# Patient Record
Sex: Female | Born: 1935 | Race: White | Hispanic: No | Marital: Single | Smoking: Former smoker
Health system: Southern US, Community
[De-identification: ages and names within clinical notes are randomized; demographics above are authoritative.]

## PROBLEM LIST (undated history)

## (undated) DIAGNOSIS — M1990 Unspecified osteoarthritis, unspecified site: Secondary | ICD-10-CM

## (undated) DIAGNOSIS — I872 Venous insufficiency (chronic) (peripheral): Secondary | ICD-10-CM

## (undated) DIAGNOSIS — R413 Other amnesia: Secondary | ICD-10-CM

## (undated) DIAGNOSIS — I480 Paroxysmal atrial fibrillation: Secondary | ICD-10-CM

## (undated) DIAGNOSIS — T462X1A Poisoning by other antidysrhythmic drugs, accidental (unintentional), initial encounter: Secondary | ICD-10-CM

## (undated) DIAGNOSIS — S82409A Unspecified fracture of shaft of unspecified fibula, initial encounter for closed fracture: Secondary | ICD-10-CM

## (undated) DIAGNOSIS — E039 Hypothyroidism, unspecified: Secondary | ICD-10-CM

## (undated) DIAGNOSIS — E88819 Insulin resistance, unspecified: Secondary | ICD-10-CM

## (undated) DIAGNOSIS — Z87440 Personal history of urinary (tract) infections: Secondary | ICD-10-CM

## (undated) DIAGNOSIS — D649 Anemia, unspecified: Secondary | ICD-10-CM

## (undated) DIAGNOSIS — R2681 Unsteadiness on feet: Secondary | ICD-10-CM

## (undated) DIAGNOSIS — E8881 Metabolic syndrome: Secondary | ICD-10-CM

## (undated) DIAGNOSIS — I10 Essential (primary) hypertension: Secondary | ICD-10-CM

## (undated) DIAGNOSIS — J449 Chronic obstructive pulmonary disease, unspecified: Secondary | ICD-10-CM

## (undated) HISTORY — DX: Essential (primary) hypertension: I10

## (undated) HISTORY — DX: Paroxysmal atrial fibrillation: I48.0

## (undated) HISTORY — DX: Unsteadiness on feet: R26.81

## (undated) HISTORY — DX: Hypothyroidism, unspecified: E03.9

## (undated) HISTORY — DX: Anemia, unspecified: D64.9

## (undated) HISTORY — PX: TOTAL HIP ARTHROPLASTY: SHX124

## (undated) HISTORY — DX: Personal history of urinary (tract) infections: Z87.440

## (undated) HISTORY — PX: CHOLECYSTECTOMY: SHX55

## (undated) HISTORY — PX: TONSILLECTOMY: SUR1361

## (undated) HISTORY — DX: Insulin resistance, unspecified: E88.819

## (undated) HISTORY — PX: APPENDECTOMY: SHX54

## (undated) HISTORY — DX: Poisoning by other antidysrhythmic drugs, accidental (unintentional), initial encounter: T46.2X1A

## (undated) HISTORY — DX: Unspecified osteoarthritis, unspecified site: M19.90

## (undated) HISTORY — DX: Other amnesia: R41.3

## (undated) HISTORY — DX: Unspecified fracture of shaft of unspecified fibula, initial encounter for closed fracture: S82.409A

## (undated) HISTORY — DX: Chronic obstructive pulmonary disease, unspecified: J44.9

## (undated) HISTORY — DX: Metabolic syndrome: E88.81

## (undated) HISTORY — DX: Venous insufficiency (chronic) (peripheral): I87.2

---

## 2009-03-24 HISTORY — PX: TRANSTHORACIC ECHOCARDIOGRAM: SHX275

## 2009-12-12 ENCOUNTER — Ambulatory Visit: Payer: Self-pay | Admitting: Diagnostic Radiology

## 2009-12-12 ENCOUNTER — Encounter: Payer: Self-pay | Admitting: Emergency Medicine

## 2009-12-12 ENCOUNTER — Inpatient Hospital Stay (HOSPITAL_COMMUNITY): Admission: EM | Admit: 2009-12-12 | Discharge: 2009-12-14 | Payer: Self-pay | Admitting: Internal Medicine

## 2009-12-21 ENCOUNTER — Ambulatory Visit: Payer: Self-pay | Admitting: Cardiology

## 2009-12-23 ENCOUNTER — Ambulatory Visit: Payer: Self-pay | Admitting: Internal Medicine

## 2009-12-24 ENCOUNTER — Encounter: Payer: Self-pay | Admitting: Cardiology

## 2009-12-24 ENCOUNTER — Inpatient Hospital Stay (HOSPITAL_COMMUNITY): Admission: EM | Admit: 2009-12-24 | Discharge: 2009-12-28 | Payer: Self-pay | Admitting: Emergency Medicine

## 2010-01-01 ENCOUNTER — Ambulatory Visit: Payer: Self-pay | Admitting: Cardiology

## 2010-01-03 ENCOUNTER — Ambulatory Visit: Payer: Self-pay | Admitting: Cardiology

## 2010-01-17 ENCOUNTER — Ambulatory Visit: Payer: Self-pay | Admitting: Cardiovascular Disease

## 2010-01-17 ENCOUNTER — Encounter: Admission: RE | Admit: 2010-01-17 | Discharge: 2010-01-17 | Payer: Self-pay | Admitting: Cardiology

## 2010-02-25 ENCOUNTER — Ambulatory Visit: Payer: Self-pay | Admitting: Cardiology

## 2010-06-06 LAB — CARDIAC PANEL(CRET KIN+CKTOT+MB+TROPI)
CK, MB: 2.2 ng/mL (ref 0.3–4.0)
CK, MB: 2.5 ng/mL (ref 0.3–4.0)
Total CK: 25 U/L (ref 7–177)
Total CK: 27 U/L (ref 7–177)
Troponin I: 0.15 ng/mL — ABNORMAL HIGH (ref 0.00–0.06)

## 2010-06-06 LAB — DIFFERENTIAL
Basophils Absolute: 0.1 10*3/uL (ref 0.0–0.1)
Basophils Relative: 0 % (ref 0–1)
Basophils Relative: 0 % (ref 0–1)
Eosinophils Absolute: 0.2 10*3/uL (ref 0.0–0.7)
Eosinophils Relative: 0 % (ref 0–5)
Lymphocytes Relative: 6 % — ABNORMAL LOW (ref 12–46)
Lymphocytes Relative: 7 % — ABNORMAL LOW (ref 12–46)
Lymphs Abs: 1.8 10*3/uL (ref 0.7–4.0)
Monocytes Absolute: 0.6 10*3/uL (ref 0.1–1.0)
Monocytes Relative: 8 % (ref 3–12)
Monocytes Relative: 9 % (ref 3–12)
Neutro Abs: 6.9 10*3/uL (ref 1.7–7.7)
Neutro Abs: 7.4 10*3/uL (ref 1.7–7.7)
Neutrophils Relative %: 81 % — ABNORMAL HIGH (ref 43–77)
Neutrophils Relative %: 84 % — ABNORMAL HIGH (ref 43–77)

## 2010-06-06 LAB — CBC
HCT: 30.3 % — ABNORMAL LOW (ref 36.0–46.0)
HCT: 31.7 % — ABNORMAL LOW (ref 36.0–46.0)
HCT: 34.2 % — ABNORMAL LOW (ref 36.0–46.0)
Hemoglobin: 10 g/dL — ABNORMAL LOW (ref 12.0–15.0)
Hemoglobin: 10.2 g/dL — ABNORMAL LOW (ref 12.0–15.0)
Hemoglobin: 10.6 g/dL — ABNORMAL LOW (ref 12.0–15.0)
Hemoglobin: 10.5 g/dL — ABNORMAL LOW (ref 12.0–15.0)
MCH: 29.2 pg (ref 26.0–34.0)
MCH: 29.8 pg (ref 26.0–34.0)
MCHC: 32.8 g/dL (ref 30.0–36.0)
MCHC: 33.6 g/dL (ref 30.0–36.0)
MCHC: 33.9 g/dL (ref 30.0–36.0)
MCHC: 33.9 g/dL (ref 30.0–36.0)
MCV: 86.6 fL (ref 78.0–100.0)
MCV: 89 fL (ref 78.0–100.0)
MCV: 89.8 fL (ref 78.0–100.0)
Platelets: 312 10*3/uL (ref 150–400)
Platelets: 429 10*3/uL — ABNORMAL HIGH (ref 150–400)
Platelets: 437 10*3/uL — ABNORMAL HIGH (ref 150–400)
Platelets: 453 10*3/uL — ABNORMAL HIGH (ref 150–400)
Platelets: 173 10*3/uL (ref 150–400)
RBC: 3.46 MIL/uL — ABNORMAL LOW (ref 3.87–5.11)
RBC: 3.5 MIL/uL — ABNORMAL LOW (ref 3.87–5.11)
RBC: 3.61 MIL/uL — ABNORMAL LOW (ref 3.87–5.11)
RBC: 3.73 MIL/uL — ABNORMAL LOW (ref 3.87–5.11)
RBC: 4.13 MIL/uL (ref 3.87–5.11)
RBC: 3.48 MIL/uL — ABNORMAL LOW (ref 3.87–5.11)
RBC: 3.58 MIL/uL — ABNORMAL LOW (ref 3.87–5.11)
RDW: 13.7 % (ref 11.5–15.5)
RDW: 13.8 % (ref 11.5–15.5)
RDW: 12.4 % (ref 11.5–15.5)
WBC: 12.3 10*3/uL — ABNORMAL HIGH (ref 4.0–10.5)
WBC: 6.6 10*3/uL (ref 4.0–10.5)
WBC: 9.5 10*3/uL (ref 4.0–10.5)
WBC: 8 10*3/uL (ref 4.0–10.5)
WBC: 8.8 10*3/uL (ref 4.0–10.5)

## 2010-06-06 LAB — GLUCOSE, CAPILLARY
Glucose-Capillary: 106 mg/dL — ABNORMAL HIGH (ref 70–99)
Glucose-Capillary: 111 mg/dL — ABNORMAL HIGH (ref 70–99)
Glucose-Capillary: 117 mg/dL — ABNORMAL HIGH (ref 70–99)
Glucose-Capillary: 119 mg/dL — ABNORMAL HIGH (ref 70–99)
Glucose-Capillary: 119 mg/dL — ABNORMAL HIGH (ref 70–99)
Glucose-Capillary: 139 mg/dL — ABNORMAL HIGH (ref 70–99)
Glucose-Capillary: 169 mg/dL — ABNORMAL HIGH (ref 70–99)
Glucose-Capillary: 180 mg/dL — ABNORMAL HIGH (ref 70–99)
Glucose-Capillary: 185 mg/dL — ABNORMAL HIGH (ref 70–99)
Glucose-Capillary: 102 mg/dL — ABNORMAL HIGH (ref 70–99)
Glucose-Capillary: 112 mg/dL — ABNORMAL HIGH (ref 70–99)
Glucose-Capillary: 114 mg/dL — ABNORMAL HIGH (ref 70–99)
Glucose-Capillary: 115 mg/dL — ABNORMAL HIGH (ref 70–99)
Glucose-Capillary: 199 mg/dL — ABNORMAL HIGH (ref 70–99)

## 2010-06-06 LAB — POCT CARDIAC MARKERS
CKMB, poc: <1 ng/mL — ABNORMAL LOW (ref 1.0–8.0)
Myoglobin, poc: 66.5 ng/mL (ref 12–200)
Myoglobin, poc: 144 ng/mL (ref 12–200)
Troponin i, poc: <0.05 ng/mL (ref 0.00–0.09)
Troponin i, poc: <0.05 ng/mL (ref 0.00–0.09)

## 2010-06-06 LAB — PROTIME-INR
INR: 1 (ref 0.00–1.49)
INR: 1.36 (ref 0.00–1.49)
INR: 2.01 — ABNORMAL HIGH (ref 0.00–1.49)
Prothrombin Time: 13.4 seconds (ref 11.6–15.2)
Prothrombin Time: 17 seconds — ABNORMAL HIGH (ref 11.6–15.2)
Prothrombin Time: 22.7 seconds — ABNORMAL HIGH (ref 11.6–15.2)
Prothrombin Time: 22.9 seconds — ABNORMAL HIGH (ref 11.6–15.2)

## 2010-06-06 LAB — COMPREHENSIVE METABOLIC PANEL
ALT: 32 U/L (ref 0–35)
AST: 36 U/L (ref 0–37)
AST: 44 U/L — ABNORMAL HIGH (ref 0–37)
Albumin: 3.3 g/dL — ABNORMAL LOW (ref 3.5–5.2)
Albumin: 3.5 g/dL (ref 3.5–5.2)
Alkaline Phosphatase: 129 U/L — ABNORMAL HIGH (ref 39–117)
BUN: 14 mg/dL (ref 6–23)
CO2: 24 mEq/L (ref 19–32)
Calcium: 9.1 mg/dL (ref 8.4–10.5)
Chloride: 102 mEq/L (ref 96–112)
Chloride: 95 mEq/L — ABNORMAL LOW (ref 96–112)
Creatinine, Ser: 0.9 mg/dL (ref 0.4–1.2)
GFR calc Af Amer: >60 mL/min (ref >60)
GFR calc Af Amer: >60 mL/min (ref >60)
GFR calc non Af Amer: 51 mL/min — ABNORMAL LOW (ref >60)
Potassium: 4.1 mEq/L (ref 3.5–5.1)
Sodium: 134 mEq/L — ABNORMAL LOW (ref 135–145)
Total Bilirubin: 1.1 mg/dL (ref 0.3–1.2)
Total Protein: 6.9 g/dL (ref 6.0–8.3)

## 2010-06-06 LAB — HEPARIN LEVEL (UNFRACTIONATED)
Heparin Unfractionated: 0.34 IU/mL (ref 0.30–0.70)
Heparin Unfractionated: 0.62 IU/mL (ref 0.30–0.70)

## 2010-06-06 LAB — CK TOTAL AND CKMB (NOT AT ARMC)
CK, MB: 2.1 ng/mL (ref 0.3–4.0)
Relative Index: INVALID (ref 0.0–2.5)

## 2010-06-06 LAB — BASIC METABOLIC PANEL
BUN: 13 mg/dL (ref 6–23)
CO2: 22 mEq/L (ref 19–32)
CO2: 31 mEq/L (ref 19–32)
Calcium: 9 mg/dL (ref 8.4–10.5)
Calcium: 9.2 mg/dL (ref 8.4–10.5)
Calcium: 8.5 mg/dL (ref 8.4–10.5)
Chloride: 93 mEq/L — ABNORMAL LOW (ref 96–112)
Chloride: 97 mEq/L (ref 96–112)
Creatinine, Ser: 1.22 mg/dL — ABNORMAL HIGH (ref 0.4–1.2)
Creatinine, Ser: 1.26 mg/dL — ABNORMAL HIGH (ref 0.4–1.2)
Creatinine, Ser: 1.27 mg/dL — ABNORMAL HIGH (ref 0.4–1.2)
GFR calc Af Amer: 49 mL/min — ABNORMAL LOW (ref >60)
GFR calc Af Amer: 49 mL/min — ABNORMAL LOW (ref >60)
GFR calc Af Amer: 51 mL/min — ABNORMAL LOW (ref >60)
GFR calc Af Amer: >60 mL/min (ref >60)
GFR calc Af Amer: >60 mL/min (ref >60)
GFR calc non Af Amer: 41 mL/min — ABNORMAL LOW (ref >60)
GFR calc non Af Amer: 50 mL/min — ABNORMAL LOW (ref >60)
GFR calc non Af Amer: 56 mL/min — ABNORMAL LOW (ref >60)
Glucose, Bld: 103 mg/dL — ABNORMAL HIGH (ref 70–99)
Potassium: 3.8 mEq/L (ref 3.5–5.1)
Potassium: 4 mEq/L (ref 3.5–5.1)
Sodium: 132 mEq/L — ABNORMAL LOW (ref 135–145)
Sodium: 134 mEq/L — ABNORMAL LOW (ref 135–145)
Sodium: 132 mEq/L — ABNORMAL LOW (ref 135–145)

## 2010-06-06 LAB — URINALYSIS, ROUTINE W REFLEX MICROSCOPIC
Bilirubin Urine: NEGATIVE
Bilirubin Urine: NEGATIVE
Bilirubin Urine: NEGATIVE
Glucose, UA: NEGATIVE mg/dL
Glucose, UA: NEGATIVE mg/dL
Hgb urine dipstick: NEGATIVE
Ketones, ur: NEGATIVE mg/dL
Ketones, ur: NEGATIVE mg/dL
Nitrite: NEGATIVE
Protein, ur: 100 mg/dL — AB
Specific Gravity, Urine: 1.013 (ref 1.005–1.030)
Specific Gravity, Urine: 1.013 (ref 1.005–1.030)
Urobilinogen, UA: 0.2 mg/dL (ref 0.0–1.0)
Urobilinogen, UA: 2 mg/dL — ABNORMAL HIGH (ref 0.0–1.0)
pH: 7 (ref 5.0–8.0)

## 2010-06-06 LAB — URINE CULTURE: Culture  Setup Time: 201110062230

## 2010-06-06 LAB — URINE MICROSCOPIC-ADD ON

## 2010-06-06 LAB — HEMOGLOBIN A1C
Hgb A1c MFr Bld: 5.9 % — ABNORMAL HIGH (ref <5.7)
Mean Plasma Glucose: 123 mg/dL — ABNORMAL HIGH (ref <117)
Mean Plasma Glucose: 120 mg/dL — ABNORMAL HIGH (ref <117)

## 2010-06-06 LAB — BRAIN NATRIURETIC PEPTIDE: Pro B Natriuretic peptide (BNP): 212 pg/mL — ABNORMAL HIGH (ref 0.0–100.0)

## 2010-06-06 LAB — TSH
TSH: 1.349 u[IU]/mL (ref 0.350–4.500)
TSH: 2.466 u[IU]/mL (ref 0.350–4.500)

## 2010-06-12 ENCOUNTER — Emergency Department (INDEPENDENT_AMBULATORY_CARE_PROVIDER_SITE_OTHER): Payer: Medicare Other

## 2010-06-12 ENCOUNTER — Emergency Department (HOSPITAL_BASED_OUTPATIENT_CLINIC_OR_DEPARTMENT_OTHER)
Admission: EM | Admit: 2010-06-12 | Discharge: 2010-06-12 | Disposition: A | Discharge status: Home / Self Care | Payer: Medicare Other | Attending: Emergency Medicine | Admitting: Emergency Medicine

## 2010-06-12 DIAGNOSIS — F0390 Unspecified dementia without behavioral disturbance: Secondary | ICD-10-CM | POA: Insufficient documentation

## 2010-06-12 DIAGNOSIS — Z79899 Other long term (current) drug therapy: Secondary | ICD-10-CM | POA: Insufficient documentation

## 2010-06-12 DIAGNOSIS — I4891 Unspecified atrial fibrillation: Secondary | ICD-10-CM | POA: Insufficient documentation

## 2010-06-12 DIAGNOSIS — M549 Dorsalgia, unspecified: Secondary | ICD-10-CM

## 2010-06-12 DIAGNOSIS — W19XXXA Unspecified fall, initial encounter: Secondary | ICD-10-CM

## 2010-06-12 DIAGNOSIS — E119 Type 2 diabetes mellitus without complications: Secondary | ICD-10-CM | POA: Insufficient documentation

## 2010-06-12 DIAGNOSIS — M47812 Spondylosis without myelopathy or radiculopathy, cervical region: Secondary | ICD-10-CM | POA: Insufficient documentation

## 2010-06-12 DIAGNOSIS — I10 Essential (primary) hypertension: Secondary | ICD-10-CM | POA: Insufficient documentation

## 2010-06-12 DIAGNOSIS — S0003XA Contusion of scalp, initial encounter: Secondary | ICD-10-CM

## 2010-06-12 DIAGNOSIS — S0100XA Unspecified open wound of scalp, initial encounter: Secondary | ICD-10-CM | POA: Insufficient documentation

## 2010-06-12 DIAGNOSIS — Z043 Encounter for examination and observation following other accident: Secondary | ICD-10-CM

## 2010-06-12 DIAGNOSIS — G319 Degenerative disease of nervous system, unspecified: Secondary | ICD-10-CM | POA: Insufficient documentation

## 2010-06-12 LAB — URINE MICROSCOPIC-ADD ON

## 2010-06-12 LAB — URINALYSIS, ROUTINE W REFLEX MICROSCOPIC
Bilirubin Urine: NEGATIVE
Glucose, UA: NEGATIVE mg/dL
Hgb urine dipstick: NEGATIVE
Specific Gravity, Urine: 1.019 (ref 1.005–1.030)
Urobilinogen, UA: 0.2 mg/dL (ref 0.0–1.0)

## 2010-06-14 LAB — URINE CULTURE

## 2010-07-18 ENCOUNTER — Other Ambulatory Visit: Payer: Self-pay | Admitting: *Deleted

## 2010-07-18 MED ORDER — RAMIPRIL 10 MG PO CAPS: 10.0000 mg | ORAL_CAPSULE | Freq: Every day | ORAL | Status: DC

## 2010-07-18 NOTE — Telephone Encounter (Signed)
escribe medication per fax request  

## 2010-07-24 ENCOUNTER — Encounter: Payer: Self-pay | Admitting: Cardiology

## 2010-07-24 DIAGNOSIS — I4891 Unspecified atrial fibrillation: Secondary | ICD-10-CM | POA: Insufficient documentation

## 2010-07-24 DIAGNOSIS — R531 Weakness: Secondary | ICD-10-CM | POA: Insufficient documentation

## 2010-07-24 DIAGNOSIS — R5383 Other fatigue: Secondary | ICD-10-CM | POA: Insufficient documentation

## 2010-07-24 DIAGNOSIS — I10 Essential (primary) hypertension: Secondary | ICD-10-CM | POA: Insufficient documentation

## 2010-07-24 DIAGNOSIS — H9190 Unspecified hearing loss, unspecified ear: Secondary | ICD-10-CM | POA: Insufficient documentation

## 2010-07-24 DIAGNOSIS — H409 Unspecified glaucoma: Secondary | ICD-10-CM | POA: Insufficient documentation

## 2010-07-31 ENCOUNTER — Ambulatory Visit (INDEPENDENT_AMBULATORY_CARE_PROVIDER_SITE_OTHER): Payer: Medicare Other | Admitting: Cardiology

## 2010-07-31 ENCOUNTER — Encounter: Payer: Self-pay | Admitting: Cardiology

## 2010-07-31 DIAGNOSIS — I480 Paroxysmal atrial fibrillation: Secondary | ICD-10-CM

## 2010-07-31 DIAGNOSIS — E039 Hypothyroidism, unspecified: Secondary | ICD-10-CM | POA: Insufficient documentation

## 2010-07-31 DIAGNOSIS — I4891 Unspecified atrial fibrillation: Secondary | ICD-10-CM

## 2010-07-31 DIAGNOSIS — I10 Essential (primary) hypertension: Secondary | ICD-10-CM

## 2010-07-31 NOTE — Assessment & Plan Note (Signed)
Patient has had no recurrent atrial fibrillation since October of 2011 on amiodarone. We had decreased her amiodarone dose in December. Since she has had no recurrence andwe will reduce it further to 100 mg per day. She would like to get off the medication completely. I think it would probably be a good idea to continue with the lower dose of amiodarone especially since she is not a candidate for anticoagulation to reduce her risk of stroke.

## 2010-07-31 NOTE — Patient Instructions (Signed)
Reduce your Amiodarone to 100 mg daily ( 0.5 tablet)  Continue your other therapy.  I will see you again for follow up in 6 months.

## 2010-07-31 NOTE — Progress Notes (Signed)
   [name] Date of Birth: 27-Nov-1924   History of Present Illness: Mrs. Ladona Ridgel is seen for followup of her atrial fibrillation. She states she's been doing well from a cardiac standpoint. She still cannot walk well and can walk only limited amounts with a walker. She does complain that she is still fatigued and sleeping a lot. She denies any palpitations or dizziness. She reports that her thyroid is now low and she is on thyroid replacement and seeing an endocrinologist in [place]. Her last TSH yesterday was apparently normal. She has had no TIA or CVA symptoms. She is still very unsteady with her gait.  Current Outpatient Prescriptions on File Prior to Visit  Medication Sig Dispense Refill  . amiodarone (PACERONE) 200 MG tablet Take 200 mg by mouth daily.       . bimatoprost (LUMIGAN) 0.03 % ophthalmic drops 1 drop at bedtime.        . Brimonidine Tartrate-Timolol (COMBIGAN OP) Apply to eye 2 (two) times daily.        Marland Kitchen levothyroxine (SYNTHROID, LEVOTHROID) 50 MCG tablet Take 50 mcg by mouth daily.        Marland Kitchen DISCONTD: betaxolol (KERLONE) 10 MG tablet Take 10 mg by mouth daily.        Marland Kitchen DISCONTD: fish oil-omega-3 fatty acids 1000 MG capsule Take 1,000 mg by mouth daily.        Marland Kitchen DISCONTD: IRON PO Take by mouth daily.        Marland Kitchen DISCONTD: metFORMIN (GLUCOPHAGE) 850 MG tablet Take 850 mg by mouth daily with breakfast.        . DISCONTD: ramipril (ALTACE) 10 MG capsule Take 1 capsule (10 mg total) by mouth daily.  30 capsule  5    No Known Allergies  Past Medical History  Diagnosis Date  . Diabetes mellitus     TYPE 2  . Hypertension   . Hearing loss   . Glaucoma   . Weakness   . Fatigue   . PAF (paroxysmal atrial fibrillation)     EF IS 55-60%  . Hypothyroid   . Gait instability   . Fibula fracture     Past Surgical History  Procedure Date  . Cholecystectomy   . Appendectomy   . Tonsillectomy   . Total hip arthroplasty     bilateral    History  Smoking status  .  Former Smoker  . Quit date: 07/24/1995  Smokeless tobacco  . Not on file    History  Alcohol Use No    History reviewed. No pertinent family history.  Review of Systems: The review of systems is positive for multiple recurrent falls.  She has had some incontinence and was treated for urinary tract infection 2 months ago. Her appetite has improved. She is interested in making a trip back to [place].All other systems were reviewed and are negative.  Physical Exam: BP 136/72  Pulse 60  Ht 5\' 2"  (1.575 m)  Wt 111 lb (50.349 kg)  BMI 20.30 kg/m2 She is a frail elderly female in no acute distress. HEENT exam is unremarkable. She has no JVD or bruits. Lungs are clear. Cardiac exam reveals a regular rate and rhythm without gallop or murmur. Abdomen is soft and nontender. She has no edema. LABORATORY DATA:   Assessment / Plan:

## 2010-07-31 NOTE — Assessment & Plan Note (Signed)
Blood pressure is well controlled currently. We will continue with her current medication.

## 2010-07-31 NOTE — Assessment & Plan Note (Signed)
This is probably related to her amiodarone. She is on appropriate thyroid replacement. We will reduce her amiodarone dose today.

## 2010-08-15 ENCOUNTER — Other Ambulatory Visit: Payer: Self-pay | Admitting: Cardiology

## 2010-08-15 NOTE — Telephone Encounter (Signed)
escribe medication per fax request  

## 2011-02-28 ENCOUNTER — Encounter: Payer: Self-pay | Admitting: Cardiology

## 2011-02-28 ENCOUNTER — Ambulatory Visit (INDEPENDENT_AMBULATORY_CARE_PROVIDER_SITE_OTHER): Payer: Medicare Other | Admitting: Cardiology

## 2011-02-28 VITALS — BP 146/82 | HR 84 | Ht 62.0 in | Wt 125.0 lb

## 2011-02-28 DIAGNOSIS — E039 Hypothyroidism, unspecified: Secondary | ICD-10-CM

## 2011-02-28 DIAGNOSIS — I480 Paroxysmal atrial fibrillation: Secondary | ICD-10-CM

## 2011-02-28 DIAGNOSIS — E119 Type 2 diabetes mellitus without complications: Secondary | ICD-10-CM

## 2011-02-28 DIAGNOSIS — I10 Essential (primary) hypertension: Secondary | ICD-10-CM

## 2011-02-28 DIAGNOSIS — I4891 Unspecified atrial fibrillation: Secondary | ICD-10-CM

## 2011-02-28 NOTE — Patient Instructions (Signed)
Continue your current therapy  I will see you again in 6 months.   

## 2011-02-28 NOTE — Progress Notes (Signed)
   Vanessa Sherman Date of Birth: Feb 21, 1925   History of Present Illness: Vanessa Sherman is seen for followup of her atrial fibrillation. She states she's been doing well from a cardiac standpoint. She still cannot walk well and can walk only limited amounts with a walker. She does complain that she is still fatigued and sleeping a lot. She denies any palpitations or dizziness.  She has had no TIA or CVA symptoms. She is still very unsteady with her gait.  Current Outpatient Prescriptions on File Prior to Visit  Medication Sig Dispense Refill  . bimatoprost (LUMIGAN) 0.03 % ophthalmic drops 1 drop at bedtime.        . Brimonidine Tartrate-Timolol (COMBIGAN OP) Apply to eye 2 (two) times daily.        Marland Kitchen DISCONTD: amiodarone (PACERONE) 200 MG tablet TAKE 1 TABLET BY MOUTH EVERY DAY  30 tablet  5    No Known Allergies  Past Medical History  Diagnosis Date  . Diabetes mellitus     TYPE 2  . Hypertension   . Hearing loss   . Glaucoma   . Weakness   . Fatigue   . PAF (paroxysmal atrial fibrillation)     EF IS 55-60%  . Hypothyroid   . Gait instability   . Fibula fracture   . Osteoarthritis   . Anemia   . Memory impairment     Mild  . Gastroenteritis   . COPD (chronic obstructive pulmonary disease)     Past Surgical History  Procedure Date  . Cholecystectomy   . Appendectomy   . Tonsillectomy   . Total hip arthroplasty     bilateral    History  Smoking status  . Former Smoker  . Quit date: 07/24/1995  Smokeless tobacco  . Not on file    History  Alcohol Use No    History reviewed. No pertinent family history.  Review of Systems: The review of systems is positive for multiple recurrent falls.   Her appetite has improved. She is very hard of hearing. All other systems were reviewed and are negative.  Physical Exam: BP 146/82  Pulse 84  Ht 5\' 2"  (1.575 m)  Wt 125 lb (56.7 kg)  BMI 22.86 kg/m2 She is a frail elderly female in no acute distress. She is seen in a  wheelchair. HEENT exam is unremarkable. She has no JVD or bruits. Lungs are clear. Cardiac exam reveals a regular rate and rhythm without gallop or murmur. Abdomen is soft and nontender. She has no edema. LABORATORY DATA:   Assessment / Plan:

## 2011-02-28 NOTE — Assessment & Plan Note (Signed)
She has maintained sinus rhythm while on amiodarone 100 mg daily. She has developed some hyperthyroidism related to the amiodarone. She is on replacement therapy. I've recommended that she stay on amiodarone long-term since this is the best way to reduce her risk of stroke by maintaining sinus rhythm. She is not a candidate for anticoagulation. I will followup again in 6 months.

## 2011-03-06 ENCOUNTER — Telehealth: Payer: Self-pay | Admitting: Cardiology

## 2011-03-06 NOTE — Telephone Encounter (Signed)
New msg Pt returning your call. She wanted to give you the info you requested

## 2011-03-06 NOTE — Telephone Encounter (Signed)
Called to let us know her physician in [place] is Dr. Urban Gibson- phone [phone]. Faxed office note to them.

## 2011-03-30 IMAGING — CR DG HIP COMPLETE 2+V*R*
2 series · 2 of 2 positions shown · non-contrast
Comparison: None.

CLINICAL DATA: Sent fall, right hip pain

RIGHT HIP - COMPLETE 2+ VIEW

[t hip ap right]
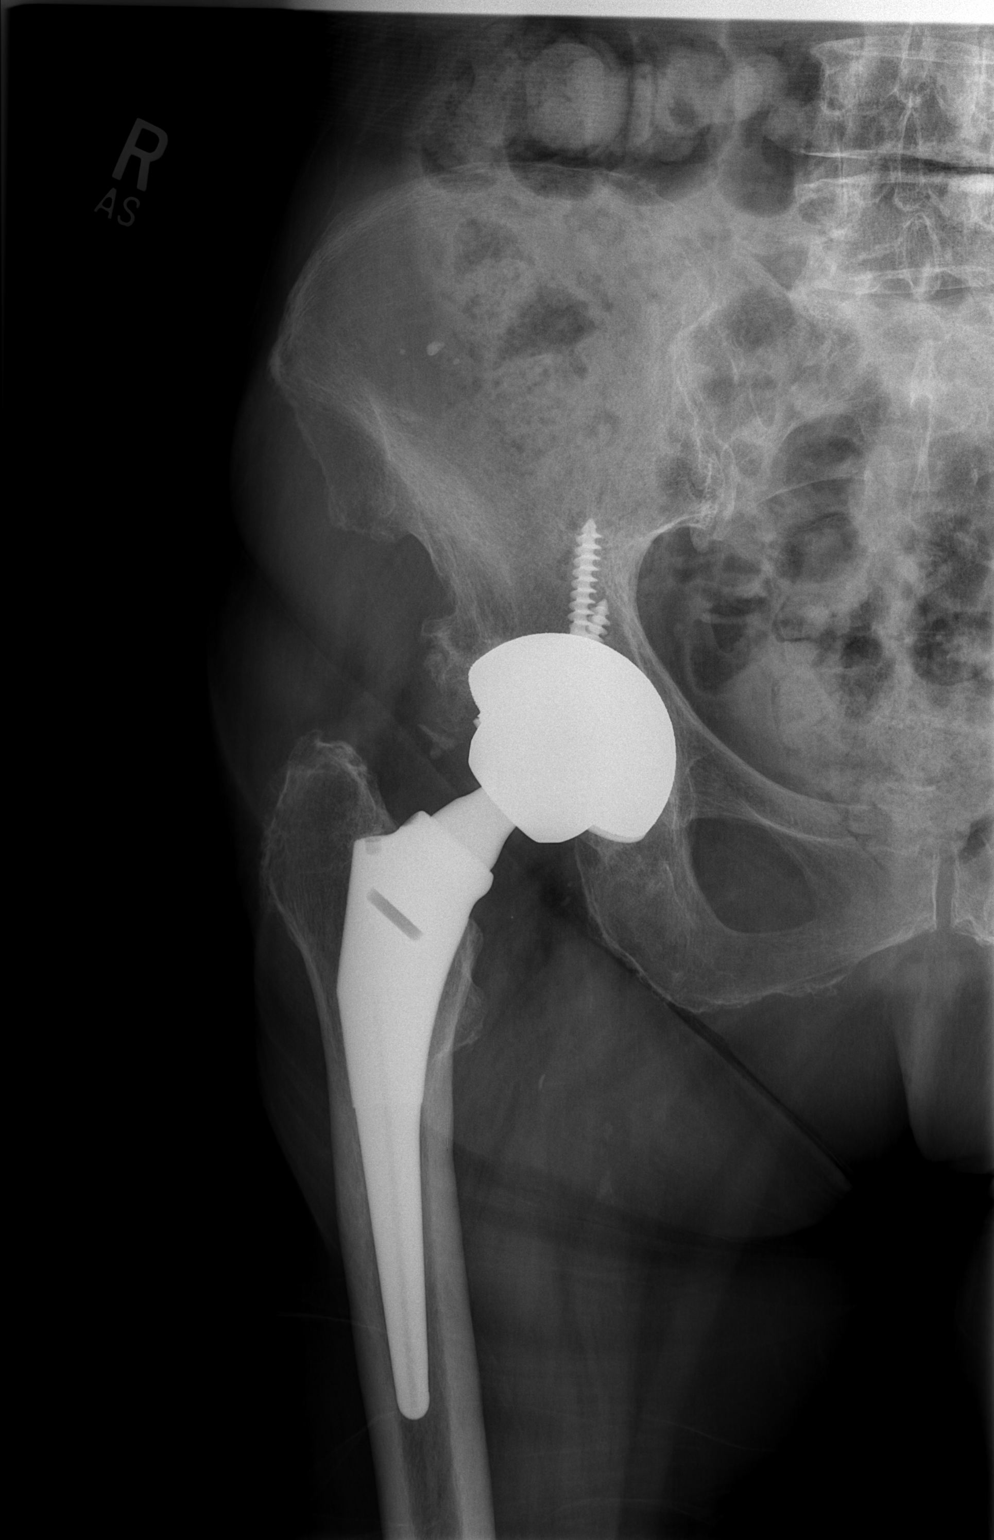

[t hip frog leg right]
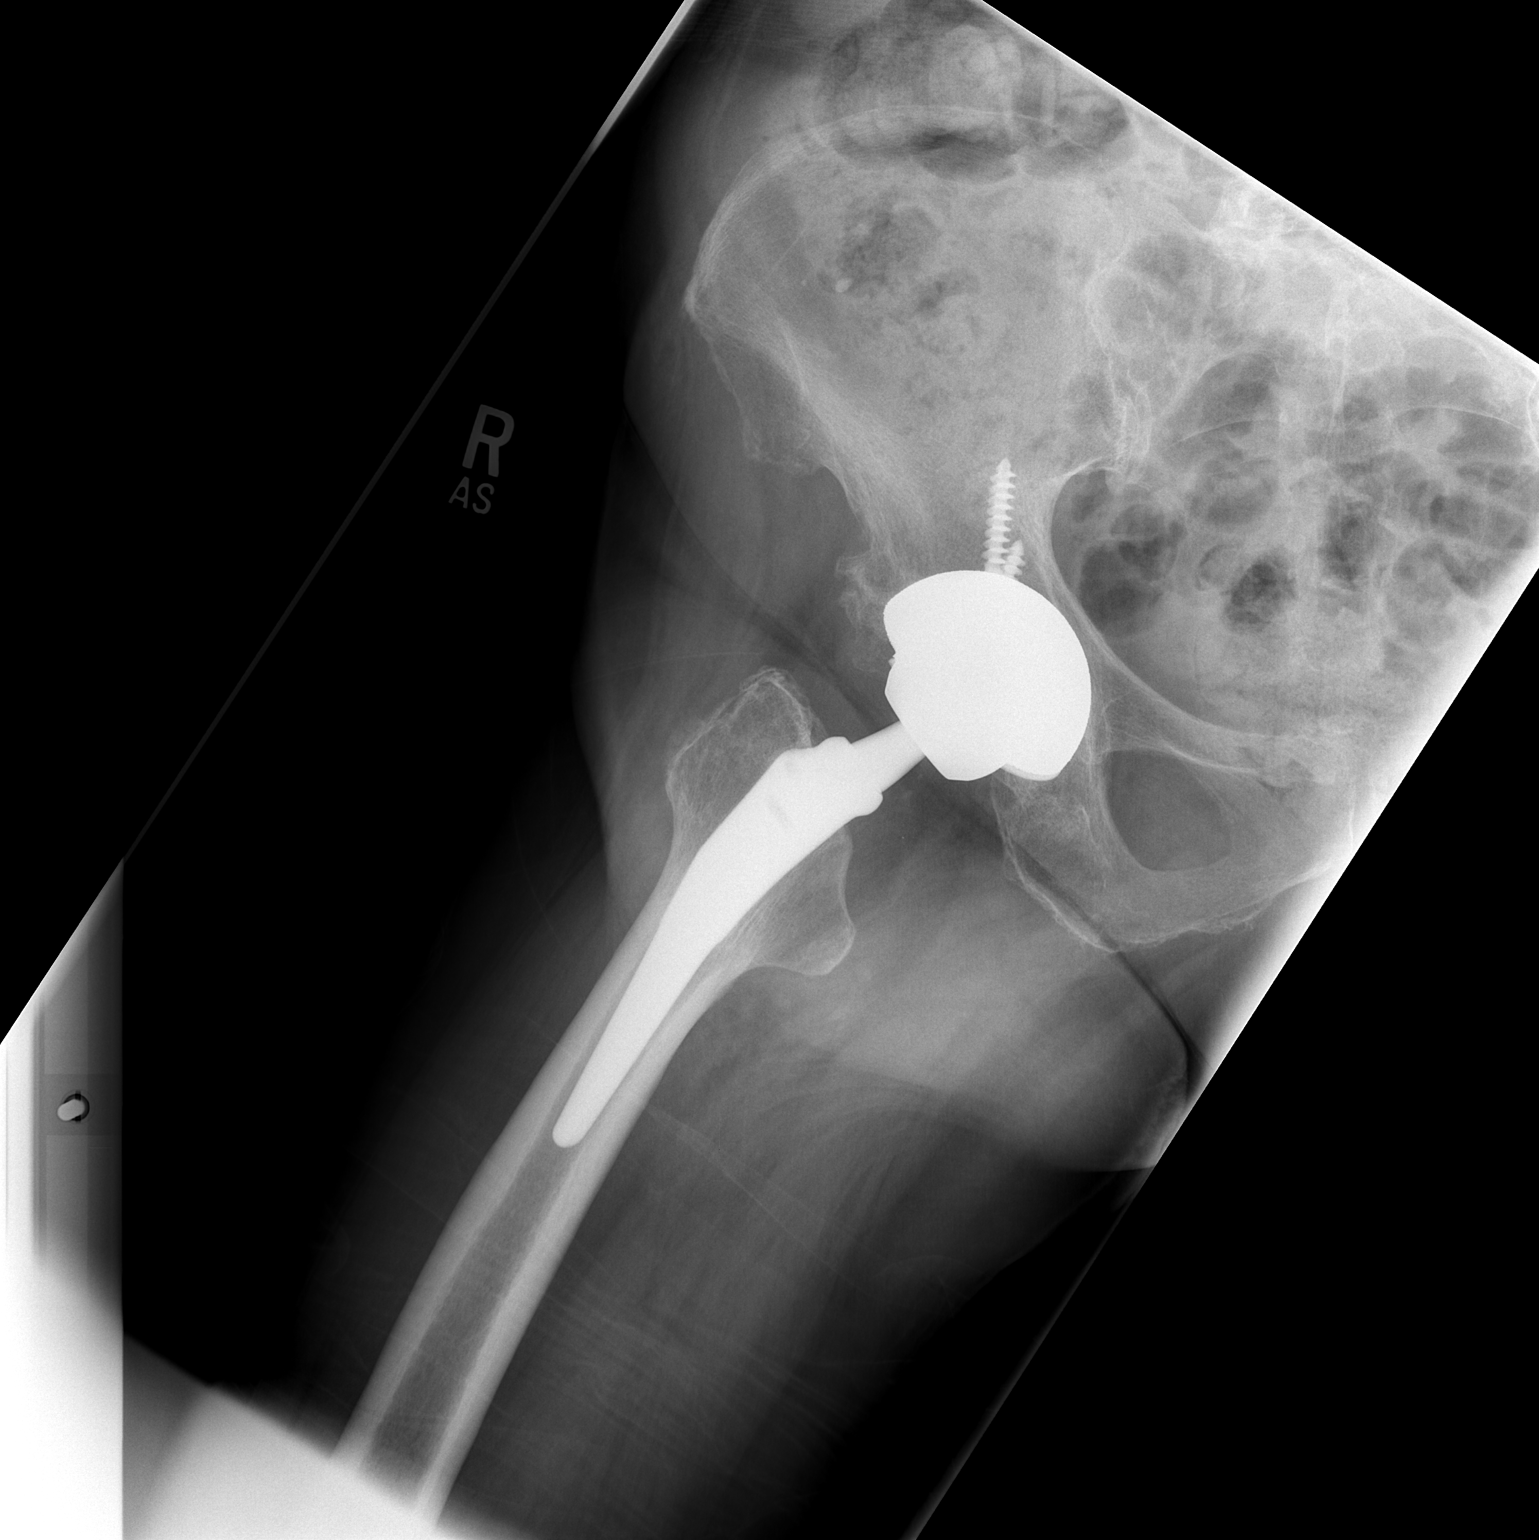

[2 of 2 positions shown; findings below may reference images not displayed]

FINDINGS: Right total hip replacement is in good position and
alignment.  No acute fracture is seen.  The bones are osteopenic.
IMPRESSION: No acute fracture.

## 2011-05-15 ENCOUNTER — Other Ambulatory Visit: Payer: Self-pay | Admitting: *Deleted

## 2011-05-15 MED ORDER — AMIODARONE HCL 200 MG PO TABS: 200.0000 mg | ORAL_TABLET | Freq: Every day | ORAL | Status: DC

## 2011-08-23 DIAGNOSIS — T462X1A Poisoning by other antidysrhythmic drugs, accidental (unintentional), initial encounter: Secondary | ICD-10-CM

## 2011-08-23 HISTORY — DX: Poisoning by other antidysrhythmic drugs, accidental (unintentional), initial encounter: T46.2X1A

## 2011-08-28 ENCOUNTER — Encounter: Payer: Self-pay | Admitting: Cardiology

## 2011-08-28 ENCOUNTER — Ambulatory Visit (INDEPENDENT_AMBULATORY_CARE_PROVIDER_SITE_OTHER): Payer: Medicare Other | Admitting: Cardiology

## 2011-08-28 VITALS — BP 160/90 | HR 86 | Ht 62.0 in | Wt 128.6 lb

## 2011-08-28 DIAGNOSIS — I480 Paroxysmal atrial fibrillation: Secondary | ICD-10-CM

## 2011-08-28 DIAGNOSIS — I4891 Unspecified atrial fibrillation: Secondary | ICD-10-CM

## 2011-08-28 DIAGNOSIS — I10 Essential (primary) hypertension: Secondary | ICD-10-CM

## 2011-08-28 DIAGNOSIS — R609 Edema, unspecified: Secondary | ICD-10-CM

## 2011-08-28 MED ORDER — TRIAMTERENE-HCTZ 37.5-25 MG PO TABS: 1.0000 | ORAL_TABLET | Freq: Every day | ORAL | Status: DC

## 2011-08-28 NOTE — Assessment & Plan Note (Signed)
Blood pressure is significantly elevated today. She also has increased ankle edema. I recommended starting her on triamterene/HCTZ 37.5/25 mg daily. We will have her return in one month to reassess her edema and her blood pressure and we will check a basic metabolic panel at that time. Continue sodium restriction.

## 2011-08-28 NOTE — Assessment & Plan Note (Signed)
Atrial fibrillation is well controlled on low-dose amiodarone. She did have some hypothyroidism related to amiodarone but her thyroid levels are normal on replacement. We have recommended long-term antiarrhythmic drug therapy to maintain sinus rhythm as this is the best way to reduce her risk of stroke. She is a poor candidate for anticoagulation given her history of falls.

## 2011-08-28 NOTE — Patient Instructions (Signed)
Avoid sodium.  Keep feet elevated when possible.  We will start you on triamterene/HCTZ 37.5/25 mg daily for blood pressure and swelling.  We will have you return in 1 month to recheck you.

## 2011-08-28 NOTE — Progress Notes (Signed)
   Vanessa Sherman Date of Birth: 1924/05/12   History of Present Illness: Vanessa Sherman is seen for followup of her atrial fibrillation. She reports she is doing well from a cardiac standpoint he denies any significant chest pain, shortness of breath, orthopnea. She has complained of increasing swelling in her ankles and feet to the point where she has a hard time getting her shoes on. She has been trying to keep her legs elevated over the past week. Her daughter does all the cooking and tries to limit sodium intake. Her activity is still limited with walking with a walker.  Current Outpatient Prescriptions on File Prior to Visit  Medication Sig Dispense Refill  . amiodarone (PACERONE) 200 MG tablet Take 1 tablet (200 mg total) by mouth daily.  30 tablet  6  . levothyroxine (SYNTHROID, LEVOTHROID) 50 MCG tablet Take 50 mcg by mouth daily.        Marland Kitchen triamterene-hydrochlorothiazide (MAXZIDE-25) 37.5-25 MG per tablet Take 1 each (1 tablet total) by mouth daily.  30 tablet  11    No Known Allergies  Past Medical History  Diagnosis Date  . Diabetes mellitus     TYPE 2  . Hypertension   . Hearing loss   . Glaucoma   . Weakness   . Fatigue   . PAF (paroxysmal atrial fibrillation)     EF IS 55-60%  . Hypothyroid   . Gait instability   . Fibula fracture   . Osteoarthritis   . Anemia   . Memory impairment     Mild  . Gastroenteritis   . COPD (chronic obstructive pulmonary disease)     Past Surgical History  Procedure Date  . Cholecystectomy   . Appendectomy   . Tonsillectomy   . Total hip arthroplasty     bilateral    History  Smoking status  . Former Smoker  . Quit date: 07/24/1995  Smokeless tobacco  . Not on file    History  Alcohol Use No    History reviewed. No pertinent family history.  Review of Systems: The review of systems is positive for history of recurrent falls.   Her appetite has good. She is  hard of hearing. All other systems were reviewed and are  negative.  Physical Exam: BP 160/90  Pulse 86  Ht 5\' 2"  (1.575 m)  Wt 128 lb 9.6 oz (58.333 kg)  BMI 23.52 kg/m2 She is a frail elderly female in no acute distress. She is seen in a wheelchair. HEENT exam is unremarkable. Pupils are equal round and reactive to light. Sclera are clear. Oropharynx is clear. She has no JVD or bruits. There is no adenopathy or thyromegaly. Lungs are clear. Cardiac exam reveals a regular rate and rhythm without gallop or murmur. Abdomen is soft and nontender. No masses or bruits. She has 1-2+ ankle and pedal edema. She is alert and oriented x3. She is hard of hearing. Otherwise cranial nerves are intact. Skin is warm and dry. LABORATORY DATA: ECG today demonstrates normal sinus rhythm with rare PAC. It is otherwise normal. Laboratory data from 07/08/2011 glucose of 121, BUN 17, creatinine 0.99. All other chemistries are normal. TSH is normal at 3.66. CBC is normal. Total cholesterol 235, HDL 76, triglycerides 90, LDL 141.  Assessment / Plan:

## 2011-09-02 ENCOUNTER — Encounter: Payer: Self-pay | Admitting: Cardiology

## 2011-09-15 ENCOUNTER — Inpatient Hospital Stay (HOSPITAL_COMMUNITY): Payer: Medicare Other

## 2011-09-15 ENCOUNTER — Encounter (HOSPITAL_BASED_OUTPATIENT_CLINIC_OR_DEPARTMENT_OTHER): Payer: Self-pay | Admitting: Family Medicine

## 2011-09-15 ENCOUNTER — Emergency Department (HOSPITAL_BASED_OUTPATIENT_CLINIC_OR_DEPARTMENT_OTHER): Payer: Medicare Other

## 2011-09-15 ENCOUNTER — Inpatient Hospital Stay (HOSPITAL_BASED_OUTPATIENT_CLINIC_OR_DEPARTMENT_OTHER)
Admission: EM | Admit: 2011-09-15 | Discharge: 2011-09-17 | DRG: 690 | Disposition: A | Discharge status: Home / Self Care | Payer: Medicare Other | Source: Ambulatory Visit | Attending: Internal Medicine | Admitting: Internal Medicine

## 2011-09-15 DIAGNOSIS — D649 Anemia, unspecified: Secondary | ICD-10-CM | POA: Diagnosis present

## 2011-09-15 DIAGNOSIS — R609 Edema, unspecified: Secondary | ICD-10-CM

## 2011-09-15 DIAGNOSIS — J449 Chronic obstructive pulmonary disease, unspecified: Secondary | ICD-10-CM | POA: Diagnosis present

## 2011-09-15 DIAGNOSIS — E119 Type 2 diabetes mellitus without complications: Secondary | ICD-10-CM | POA: Diagnosis present

## 2011-09-15 DIAGNOSIS — E782 Mixed hyperlipidemia: Secondary | ICD-10-CM

## 2011-09-15 DIAGNOSIS — I4891 Unspecified atrial fibrillation: Secondary | ICD-10-CM | POA: Diagnosis present

## 2011-09-15 DIAGNOSIS — I10 Essential (primary) hypertension: Secondary | ICD-10-CM | POA: Diagnosis present

## 2011-09-15 DIAGNOSIS — I509 Heart failure, unspecified: Secondary | ICD-10-CM | POA: Diagnosis present

## 2011-09-15 DIAGNOSIS — R740 Nonspecific elevation of levels of transaminase and lactic acid dehydrogenase [LDH]: Secondary | ICD-10-CM

## 2011-09-15 DIAGNOSIS — R531 Weakness: Secondary | ICD-10-CM

## 2011-09-15 DIAGNOSIS — R112 Nausea with vomiting, unspecified: Secondary | ICD-10-CM

## 2011-09-15 DIAGNOSIS — R5383 Other fatigue: Secondary | ICD-10-CM

## 2011-09-15 DIAGNOSIS — R413 Other amnesia: Secondary | ICD-10-CM | POA: Diagnosis present

## 2011-09-15 DIAGNOSIS — I5032 Chronic diastolic (congestive) heart failure: Secondary | ICD-10-CM | POA: Diagnosis present

## 2011-09-15 DIAGNOSIS — I480 Paroxysmal atrial fibrillation: Secondary | ICD-10-CM

## 2011-09-15 DIAGNOSIS — R748 Abnormal levels of other serum enzymes: Secondary | ICD-10-CM

## 2011-09-15 DIAGNOSIS — Z87891 Personal history of nicotine dependence: Secondary | ICD-10-CM

## 2011-09-15 DIAGNOSIS — J4489 Other specified chronic obstructive pulmonary disease: Secondary | ICD-10-CM | POA: Diagnosis present

## 2011-09-15 DIAGNOSIS — N390 Urinary tract infection, site not specified: Principal | ICD-10-CM | POA: Diagnosis present

## 2011-09-15 DIAGNOSIS — M25559 Pain in unspecified hip: Secondary | ICD-10-CM | POA: Diagnosis present

## 2011-09-15 DIAGNOSIS — R197 Diarrhea, unspecified: Secondary | ICD-10-CM | POA: Diagnosis present

## 2011-09-15 DIAGNOSIS — J849 Interstitial pulmonary disease, unspecified: Secondary | ICD-10-CM | POA: Diagnosis present

## 2011-09-15 DIAGNOSIS — E039 Hypothyroidism, unspecified: Secondary | ICD-10-CM | POA: Diagnosis present

## 2011-09-15 DIAGNOSIS — Z96649 Presence of unspecified artificial hip joint: Secondary | ICD-10-CM

## 2011-09-15 DIAGNOSIS — R109 Unspecified abdominal pain: Secondary | ICD-10-CM | POA: Diagnosis present

## 2011-09-15 LAB — COMPREHENSIVE METABOLIC PANEL
AST: 208 U/L — ABNORMAL HIGH (ref 0–37)
Albumin: 3.6 g/dL (ref 3.5–5.2)
Alkaline Phosphatase: 175 U/L — ABNORMAL HIGH (ref 39–117)
BUN: 23 mg/dL (ref 6–23)
CO2: 27 mEq/L (ref 19–32)
Chloride: 93 mEq/L — ABNORMAL LOW (ref 96–112)
GFR calc non Af Amer: 50 mL/min — ABNORMAL LOW (ref >90)
Potassium: 3.5 mEq/L (ref 3.5–5.1)
Total Bilirubin: 0.3 mg/dL (ref 0.3–1.2)

## 2011-09-15 LAB — DIFFERENTIAL
Basophils Relative: 0 % (ref 0–1)
Lymphocytes Relative: 2 % — ABNORMAL LOW (ref 12–46)
Monocytes Absolute: 0.7 10*3/uL (ref 0.1–1.0)
Monocytes Relative: 5 % (ref 3–12)
Neutro Abs: 12.6 10*3/uL — ABNORMAL HIGH (ref 1.7–7.7)
Neutrophils Relative %: 93 % — ABNORMAL HIGH (ref 43–77)

## 2011-09-15 LAB — URINALYSIS, ROUTINE W REFLEX MICROSCOPIC
Glucose, UA: NEGATIVE mg/dL
Ketones, ur: NEGATIVE mg/dL
Nitrite: POSITIVE — AB
Specific Gravity, Urine: 1.018 (ref 1.005–1.030)
pH: 5 (ref 5.0–8.0)

## 2011-09-15 LAB — LIPASE, BLOOD: Lipase: 47 U/L (ref 11–59)

## 2011-09-15 LAB — URINE MICROSCOPIC-ADD ON

## 2011-09-15 LAB — CBC
HCT: 32.9 % — ABNORMAL LOW (ref 36.0–46.0)
Hemoglobin: 11.3 g/dL — ABNORMAL LOW (ref 12.0–15.0)
MCHC: 34.3 g/dL (ref 30.0–36.0)
RBC: 3.79 MIL/uL — ABNORMAL LOW (ref 3.87–5.11)
WBC: 13.6 10*3/uL — ABNORMAL HIGH (ref 4.0–10.5)

## 2011-09-15 MED ORDER — ACETAMINOPHEN 325 MG PO TABS
ORAL_TABLET | ORAL | Status: AC
  Administered 2011-09-15: 650 mg
  Filled 2011-09-15: qty 2

## 2011-09-15 MED ORDER — SODIUM CHLORIDE 0.9 % IV SOLN: INTRAVENOUS | Status: DC

## 2011-09-15 MED ORDER — ACETAMINOPHEN 650 MG RE SUPP: 650.0000 mg | Freq: Four times a day (QID) | RECTAL | Status: DC | PRN

## 2011-09-15 MED ORDER — LEVOTHYROXINE SODIUM 50 MCG PO TABS
50.0000 ug | ORAL_TABLET | Freq: Every day | ORAL | Status: DC
  Administered 2011-09-16 – 2011-09-17 (×2): 50 ug via ORAL
  Filled 2011-09-15 (×4): qty 1

## 2011-09-15 MED ORDER — AMIODARONE HCL 200 MG PO TABS
200.0000 mg | ORAL_TABLET | Freq: Every day | ORAL | Status: DC
  Administered 2011-09-16 (×2): 200 mg via ORAL
  Filled 2011-09-15 (×4): qty 1

## 2011-09-15 MED ORDER — ONDANSETRON HCL 4 MG/2ML IJ SOLN: 4.0000 mg | Freq: Four times a day (QID) | INTRAMUSCULAR | Status: DC | PRN

## 2011-09-15 MED ORDER — SODIUM CHLORIDE 0.9 % IV SOLN
INTRAVENOUS | Status: AC
  Administered 2011-09-15: 21:00:00 via INTRAVENOUS

## 2011-09-15 MED ORDER — IOHEXOL 300 MG/ML  SOLN
20.0000 mL | INTRAMUSCULAR | Status: AC
  Administered 2011-09-15 (×2): 20 mL via ORAL

## 2011-09-15 MED ORDER — SODIUM CHLORIDE 0.9 % IV SOLN
Freq: Once | INTRAVENOUS | Status: AC
  Administered 2011-09-15: 16:00:00 via INTRAVENOUS

## 2011-09-15 MED ORDER — DEXTROSE 5 % IV SOLN
1.0000 g | INTRAVENOUS | Status: DC
  Administered 2011-09-16 (×2): 1 g via INTRAVENOUS
  Filled 2011-09-15 (×3): qty 10

## 2011-09-15 MED ORDER — ONDANSETRON HCL 4 MG/2ML IJ SOLN: 4.0000 mg | Freq: Three times a day (TID) | INTRAMUSCULAR | Status: DC | PRN

## 2011-09-15 MED ORDER — ONDANSETRON HCL 4 MG PO TABS: 4.0000 mg | ORAL_TABLET | Freq: Four times a day (QID) | ORAL | Status: DC | PRN

## 2011-09-15 MED ORDER — ACETAMINOPHEN 325 MG PO TABS: 650.0000 mg | ORAL_TABLET | ORAL | Status: DC | PRN

## 2011-09-15 MED ORDER — IOHEXOL 300 MG/ML  SOLN
100.0000 mL | Freq: Once | INTRAMUSCULAR | Status: AC | PRN
  Administered 2011-09-15: 100 mL via INTRAVENOUS

## 2011-09-15 MED ORDER — DEXTROSE 5 % IV SOLN
1.0000 g | Freq: Once | INTRAVENOUS | Status: AC
  Administered 2011-09-15: 1 g via INTRAVENOUS
  Filled 2011-09-15: qty 10

## 2011-09-15 MED ORDER — ACETAMINOPHEN 325 MG PO TABS
650.0000 mg | ORAL_TABLET | Freq: Four times a day (QID) | ORAL | Status: DC | PRN
  Administered 2011-09-16: 325 mg via ORAL
  Filled 2011-09-15: qty 2

## 2011-09-15 MED ORDER — ONDANSETRON HCL 4 MG/2ML IJ SOLN
4.0000 mg | Freq: Once | INTRAMUSCULAR | Status: AC
Start: 2011-09-15 — End: 2011-09-15
  Administered 2011-09-15: 4 mg via INTRAVENOUS
  Filled 2011-09-15: qty 2

## 2011-09-15 NOTE — ED Notes (Addendum)
Pt daughter sts pt started vomiting 2 hrs ago after eating lunch. Pt denies pain, responsive to voice. Emesis noted to face, malodorous urine.

## 2011-09-15 NOTE — ED Notes (Signed)
pts daughter left and will back to get the pts room #

## 2011-09-15 NOTE — ED Provider Notes (Addendum)
History   This chart was scribed for Dione Booze, MD by Melba Coon. The patient was seen in room MH11/MH11 and the patient's care was started at 3:45PM.    CSN: [id number]  Arrival date & time 09/15/11  1526   First MD Initiated Contact with Patient 09/15/11 1535      Chief Complaint  Patient presents with  . Emesis    (Consider location/radiation/quality/duration/timing/severity/associated sxs/prior treatment) HPI A Level 5 Caveat applies due to the condition of the patient (dementia). Vanessa Sherman is a 78 y.o. female who presents to the Emergency Department complaining of persistent, moderate to severe emesis with an onset today. Hx provided and translated by family. Pt speaks [place]. Pt was eating lunch today when she started to vomit. When family started to touch her abd, pt actively replied not to do that, then soon after she started to vomit. Pt is on a new heart/ cardiovascular medication; family was concerned about the side effects. Apparent fever present; no temp taken. Abd pain and cough present. No HA, fever, neck pain, sore throat, rash, back pain, CP, SOB, diarrhea, dysuria, or extremity pain, edema, weakness, numbness, or tingling. No known allergies. No other pertinent medical symptoms.  PCP: Dr. Cyndia Bent Cardiologist: Dr. [place]  Past Medical History  Diagnosis Date  . Diabetes mellitus     TYPE 2  . Hypertension   . Hearing loss   . Glaucoma   . Weakness   . Fatigue   . PAF (paroxysmal atrial fibrillation)     EF IS 55-60%  . Hypothyroid   . Gait instability   . Fibula fracture   . Osteoarthritis   . Anemia   . Memory impairment     Mild  . Gastroenteritis   . COPD (chronic obstructive pulmonary disease)     Past Surgical History  Procedure Date  . Cholecystectomy   . Appendectomy   . Tonsillectomy   . Total hip arthroplasty     bilateral    No family history on file.  History  Substance Use Topics  . Smoking status: Former Smoker   Quit date: 07/24/1995  . Smokeless tobacco: Not on file  . Alcohol Use: No    OB History    Grav Para Term Preterm Abortions TAB SAB Ect Mult Living                  Review of Systems 10 Systems reviewed and all are negative for acute change except as noted in the HPI.   Allergies  Review of patient's allergies indicates no known allergies.  Home Medications   Current Outpatient Rx  Name Route Sig Dispense Refill  . AMIODARONE HCL 200 MG PO TABS Oral Take 1 tablet (200 mg total) by mouth daily. 30 tablet 6  . LEVOTHYROXINE SODIUM 50 MCG PO TABS Oral Take 50 mcg by mouth daily.      . TRIAMTERENE-HCTZ 37.5-25 MG PO TABS Oral Take 1 each (1 tablet total) by mouth daily. 30 tablet 11    BP 129/57  Pulse 93  Temp 98.9 F (37.2 C) (Oral)  Resp 18  SpO2 97%  Physical Exam  Nursing note and vitals reviewed. Constitutional: She is oriented to person, place, and time. She appears well-developed and well-nourished. No distress.  HENT:  Head: Normocephalic and atraumatic.  Right Ear: External ear normal.  Left Ear: External ear normal.  Mouth/Throat: Oropharynx is clear and moist.  Eyes: EOM are normal.  Neck: Normal range of motion. No  tracheal deviation present.  Cardiovascular: Normal rate, regular rhythm and normal heart sounds.   No murmur heard. Pulmonary/Chest: Effort normal and breath sounds normal. No respiratory distress.  Abdominal: Soft. She exhibits distension. There is tenderness (Mild periumblical tenderness). There is no rebound and no guarding.       Bowel sounds decreased.  Musculoskeletal: Normal range of motion. She exhibits edema (2+ pedal edema). She exhibits no tenderness.  Neurological: She is alert and oriented to person, place, and time.  Skin: Skin is warm and dry.  Psychiatric: She has a normal mood and affect. Her behavior is normal.    ED Course  Procedures (including critical care time)  DIAGNOSTIC STUDIES: Oxygen Saturation is 95% on  room air, adequate by my interpretation.    COORDINATION OF CARE:  3:53PM - EDMD will order CXR, Zofran, blood w/u, UA, IV fluids, and Tylenol for the pt.   Results for orders placed during the hospital encounter of 09/15/11  URINALYSIS, ROUTINE W REFLEX MICROSCOPIC      Component Value Range   Color, Urine YELLOW  YELLOW   APPearance CLEAR  CLEAR   Specific Gravity, Urine 1.018  1.005 - 1.030   pH 5.0  5.0 - 8.0   Glucose, UA NEGATIVE  NEGATIVE mg/dL   Hgb urine dipstick SMALL (*) NEGATIVE   Bilirubin Urine NEGATIVE  NEGATIVE   Ketones, ur NEGATIVE  NEGATIVE mg/dL   Protein, ur NEGATIVE  NEGATIVE mg/dL   Urobilinogen, UA 0.2  0.0 - 1.0 mg/dL   Nitrite POSITIVE (*) NEGATIVE   Leukocytes, UA SMALL (*) NEGATIVE  CBC      Component Value Range   WBC 13.6 (*) 4.0 - 10.5 K/uL   RBC 3.79 (*) 3.87 - 5.11 MIL/uL   Hemoglobin 11.3 (*) 12.0 - 15.0 g/dL   HCT 11.9 (*) 14.7 - 82.9 %   MCV 86.8  78.0 - 100.0 fL   MCH 29.8  26.0 - 34.0 pg   MCHC 34.3  30.0 - 36.0 g/dL   RDW 56.2  13.0 - 86.5 %   Platelets 197  150 - 400 K/uL  DIFFERENTIAL      Component Value Range   Neutrophils Relative 93 (*) 43 - 77 %   Neutro Abs 12.6 (*) 1.7 - 7.7 K/uL   Lymphocytes Relative 2 (*) 12 - 46 %   Lymphs Abs 0.3 (*) 0.7 - 4.0 K/uL   Monocytes Relative 5  3 - 12 %   Monocytes Absolute 0.7  0.1 - 1.0 K/uL   Eosinophils Relative 0  0 - 5 %   Eosinophils Absolute 0.0  0.0 - 0.7 K/uL   Basophils Relative 0  0 - 1 %   Basophils Absolute 0.0  0.0 - 0.1 K/uL  COMPREHENSIVE METABOLIC PANEL      Component Value Range   Sodium 133 (*) 135 - 145 mEq/L   Potassium 3.5  3.5 - 5.1 mEq/L   Chloride 93 (*) 96 - 112 mEq/L   CO2 27  19 - 32 mEq/L   Glucose, Bld 129 (*) 70 - 99 mg/dL   BUN 23  6 - 23 mg/dL   Creatinine, Ser 7.84  0.50 - 1.10 mg/dL   Calcium 9.4  8.4 - 69.6 mg/dL   Total Protein 7.1  6.0 - 8.3 g/dL   Albumin 3.6  3.5 - 5.2 g/dL   AST 295 (*) 0 - 37 U/L   ALT 110 (*) 0 - 35 U/L   Alkaline  Phosphatase 175 (*) 39 - 117 U/L   Total Bilirubin 0.3  0.3 - 1.2 mg/dL   GFR calc non Af Amer 50 (*) >90 mL/min   GFR calc Af Amer 58 (*) >90 mL/min  LIPASE, BLOOD      Component Value Range   Lipase 47  11 - 59 U/L  URINE MICROSCOPIC-ADD ON      Component Value Range   Squamous Epithelial / LPF RARE  RARE   WBC, UA 3-6  <3 WBC/hpf   RBC / HPF 0-2  <3 RBC/hpf   Bacteria, UA MANY (*) RARE   Urine-Other LESS THAN 10 mL OF URINE SUBMITTED     Dg Chest Portable 1 View  09/15/2011  *RADIOLOGY REPORT*  Clinical Data: Fever.  PORTABLE CHEST - 1 VIEW  Comparison: 12/25/2009  Findings: Peribronchial thickening and interstitial prominence, likely chronic interstitial lung disease.  Heart is normal size. Bibasilar scarring.  No acute infiltrates or effusions.  No acute bony abnormality.  IMPRESSION: Progressive chronic interstitial lung disease.  No acute findings.  Original Report Authenticated By: Cyndie Chime, M.D.     Date: 09/15/2011  Rate: 91  Rhythm: normal sinus rhythm and premature ventricular contractions (PVC)  QRS Axis: normal  Intervals: normal  ST/T Wave abnormalities: nonspecific ST/T changes  Conduction Disutrbances:none  Narrative Interpretation: Normal sinus rhythm with occasional PVC, nonspecific ST T changes. When compared with ECG of 12/27/2009, QT has shortened.  Old EKG Reviewed: unchanged    1. Urinary tract infection   2. Elevated liver enzymes       MDM  Fever with vomiting. Patient has history of urinary tract infections and most likely this represents any urinary tract infection. Chest x-ray or be obtained as well to rule out pneumonia. She is given IV fluids, IV ondansetron for nausea, and oral acetaminophen for fever.  Urinalysis confirms presence of urinary tract infection. Elevated liver enzymes are noted of uncertain cause. She is on several medications which could conceivably cause elevated liver enzymes. I do not feel it has any indirect in her  condition today. She is started on Rocephin and case is discussed with Dr. Jomarie Longs of [facility] who agrees to accept the patient in transfer. [facility] is transferred to [facility].  I personally performed the services described in this documentation, which was scribed in my presence. The recorded information has been reviewed and considered.          Dione Booze, MD 09/15/11 1708  Dione Booze, MD 09/15/11 [phone]

## 2011-09-15 NOTE — H&P (Addendum)
Vanessa Sherman is an 78 y.o. female.   PCP - Dr.Badger. Cardiologist - Dr.Jordan. History obtained from patient's daughter, ER records and patient. Chief Complaint: Nausea vomiting and abdominal pain. HPI: 78 year-old female with history of paroxysmal atrial fibrillation not on Coumadin, hypertension, hypothyroidism and diastolic CHF was brought to the ER patient had 3 episodes of nausea vomiting after lunch today. She also complained of abdominal pain mostly in the epigastric area. She was brought to the ER at the [facility]. Over there patient was found to have increased LFTs and the UA was compatible with UTI. Patient at this time is admitted for further management of her nausea vomiting and UTI. At this time when I examined the patient patient denies any abdominal pain but still has poor appetite and does not want to eat anything. She did not have any further episodes of nausea vomiting after admission. Patient otherwise complains of right hip pain though daughter states she did not have any fall. Patient denies any chest pain shortness of breath. She did not have any diarrhea or dysuria. As per patient's daughter prior to today's lunch she had no problem.  Past Medical History  Diagnosis Date  . Diabetes mellitus     TYPE 2  . Hypertension   . Hearing loss   . Glaucoma   . Weakness   . Fatigue   . PAF (paroxysmal atrial fibrillation)     EF IS 55-60%  . Hypothyroid   . Gait instability   . Fibula fracture   . Osteoarthritis   . Anemia   . Memory impairment     Mild  . Gastroenteritis   . COPD (chronic obstructive pulmonary disease)     Past Surgical History  Procedure Date  . Cholecystectomy   . Appendectomy   . Tonsillectomy   . Total hip arthroplasty     bilateral    History reviewed. No pertinent family history. Social History:  reports that she quit smoking about 16 years ago. She does not have any smokeless tobacco history on file. She reports that she does not  drink alcohol or use illicit drugs.  Allergies: No Known Allergies  Medications Prior to Admission  Medication Sig Dispense Refill  . amiodarone (PACERONE) 200 MG tablet Take 1 tablet (200 mg total) by mouth daily.  30 tablet  6  . levothyroxine (SYNTHROID, LEVOTHROID) 50 MCG tablet Take 50 mcg by mouth daily.        Marland Kitchen triamterene-hydrochlorothiazide (MAXZIDE-25) 37.5-25 MG per tablet Take 1 each (1 tablet total) by mouth daily.  30 tablet  11    Results for orders placed during the hospital encounter of 09/15/11 (from the past 48 hour(s))  URINALYSIS, ROUTINE W REFLEX MICROSCOPIC     Status: Abnormal   Collection Time   09/15/11  3:46 PM      Component Value Range Comment   Color, Urine YELLOW  YELLOW    APPearance CLEAR  CLEAR    Specific Gravity, Urine 1.018  1.005 - 1.030    pH 5.0  5.0 - 8.0    Glucose, UA NEGATIVE  NEGATIVE mg/dL    Hgb urine dipstick SMALL (*) NEGATIVE    Bilirubin Urine NEGATIVE  NEGATIVE    Ketones, ur NEGATIVE  NEGATIVE mg/dL    Protein, ur NEGATIVE  NEGATIVE mg/dL    Urobilinogen, UA 0.2  0.0 - 1.0 mg/dL    Nitrite POSITIVE (*) NEGATIVE    Leukocytes, UA SMALL (*) NEGATIVE  URINE MICROSCOPIC-ADD ON     Status: Abnormal   Collection Time   09/15/11  3:46 PM      Component Value Range Comment   Squamous Epithelial / LPF RARE  RARE    WBC, UA 3-6  <3 WBC/hpf    RBC / HPF 0-2  <3 RBC/hpf    Bacteria, UA MANY (*) RARE    Urine-Other LESS THAN 10 mL OF URINE SUBMITTED   MICROSCOPIC EXAM PERFORMED ON UNCONCENTRATED URINE  CBC     Status: Abnormal   Collection Time   09/15/11  3:54 PM      Component Value Range Comment   WBC 13.6 (*) 4.0 - 10.5 K/uL    RBC 3.79 (*) 3.87 - 5.11 MIL/uL    Hemoglobin 11.3 (*) 12.0 - 15.0 g/dL    HCT 21.3 (*) 08.6 - 46.0 %    MCV 86.8  78.0 - 100.0 fL    MCH 29.8  26.0 - 34.0 pg    MCHC 34.3  30.0 - 36.0 g/dL    RDW 57.8  46.9 - 62.9 %    Platelets 197  150 - 400 K/uL   DIFFERENTIAL     Status: Abnormal   Collection  Time   09/15/11  3:54 PM      Component Value Range Comment   Neutrophils Relative 93 (*) 43 - 77 %    Neutro Abs 12.6 (*) 1.7 - 7.7 K/uL    Lymphocytes Relative 2 (*) 12 - 46 %    Lymphs Abs 0.3 (*) 0.7 - 4.0 K/uL    Monocytes Relative 5  3 - 12 %    Monocytes Absolute 0.7  0.1 - 1.0 K/uL    Eosinophils Relative 0  0 - 5 %    Eosinophils Absolute 0.0  0.0 - 0.7 K/uL    Basophils Relative 0  0 - 1 %    Basophils Absolute 0.0  0.0 - 0.1 K/uL   COMPREHENSIVE METABOLIC PANEL     Status: Abnormal   Collection Time   09/15/11  3:54 PM      Component Value Range Comment   Sodium 133 (*) 135 - 145 mEq/L    Potassium 3.5  3.5 - 5.1 mEq/L    Chloride 93 (*) 96 - 112 mEq/L    CO2 27  19 - 32 mEq/L    Glucose, Bld 129 (*) 70 - 99 mg/dL    BUN 23  6 - 23 mg/dL    Creatinine, Ser 5.28  0.50 - 1.10 mg/dL    Calcium 9.4  8.4 - 41.3 mg/dL    Total Protein 7.1  6.0 - 8.3 g/dL    Albumin 3.6  3.5 - 5.2 g/dL    AST 244 (*) 0 - 37 U/L    ALT 110 (*) 0 - 35 U/L    Alkaline Phosphatase 175 (*) 39 - 117 U/L    Total Bilirubin 0.3  0.3 - 1.2 mg/dL    GFR calc non Af Amer 50 (*) >90 mL/min    GFR calc Af Amer 58 (*) >90 mL/min   LIPASE, BLOOD     Status: Normal   Collection Time   09/15/11  3:54 PM      Component Value Range Comment   Lipase 47  11 - 59 U/L    Dg Chest Portable 1 View  09/15/2011  *RADIOLOGY REPORT*  Clinical Data: Fever.  PORTABLE CHEST - 1 VIEW  Comparison: 12/25/2009  Findings:  Peribronchial thickening and interstitial prominence, likely chronic interstitial lung disease.  Heart is normal size. Bibasilar scarring.  No acute infiltrates or effusions.  No acute bony abnormality.  IMPRESSION: Progressive chronic interstitial lung disease.  No acute findings.  Original Report Authenticated By: Cyndie Chime, M.D.    Review of Systems  Constitutional: Negative.   HENT: Negative.   Eyes: Negative.   Respiratory: Negative.   Cardiovascular: Negative.   Gastrointestinal: Positive  for nausea, vomiting and abdominal pain.  Genitourinary: Negative.   Musculoskeletal: Positive for joint pain (Right hip pain.).  Skin: Negative.   Neurological: Negative.   Endo/Heme/Allergies: Negative.   Psychiatric/Behavioral: Negative.     Blood pressure 126/73, pulse 86, temperature 99.2 F (37.3 C), temperature source Oral, resp. rate 16, SpO2 95.00%. Physical Exam  Constitutional: She is oriented to person, place, and time. She appears well-developed and well-nourished. No distress.  HENT:  Head: Normocephalic and atraumatic.  Right Ear: External ear normal.  Left Ear: External ear normal.  Nose: Nose normal.  Mouth/Throat: Oropharynx is clear and moist. No oropharyngeal exudate.  Eyes: Conjunctivae are normal. Pupils are equal, round, and reactive to light. Right eye exhibits no discharge. No scleral icterus.  Neck: Normal range of motion. Neck supple.  Cardiovascular: Normal rate and regular rhythm.   Respiratory: Effort normal and breath sounds normal. No respiratory distress. She has no wheezes. She has no rales.  GI: Soft. Bowel sounds are normal. She exhibits no distension. There is no tenderness. There is no rebound.  Musculoskeletal: Normal range of motion. She exhibits no edema and no tenderness.  Neurological: She is alert and oriented to person, place, and time.       Moves all extremities.  Skin: Skin is warm and dry. No rash noted. She is not diaphoretic. No erythema.  Psychiatric: Her behavior is normal.     Assessment/Plan #1. Nausea vomiting and abdominal pain - this could be from the UTI also could be from viral. But given that LFTs and increased and patient had significant abdominal pain earlier I have ordered CT abdomen and pelvis to rule out any intra-abdominal causes. Check lactic acid. Continue ceftriaxone for now. Gently hydrate. #2. UTI probably causing #1 - follow urine cultures. Continue ceftriaxone. #3. Abnormal LFTs - this could be from  amiodarone. Closely follow LFTs and if it is persistently high may need to discontinue amiodarone. Check hepatitis panel. I have ordered CT abdomen pelvis to rule out other causes. Check PT/INR.  #4. Right hip pain - patient is complaining of right hip pain. As per patient's daughter patient has had previous bilateral hip replacement. Check x-ray pelvis. #5. Paroxysmal atrial fibrillation  - presently in sinus rhythm and rate controlled. As per cardiologist patient was not felt to be candidate for Coumadin.  #6. History of CHF last EF measured was 55 to 60% - presently appears compensated and does not complain of shortness of breath. #7. Hypothyroidism - continue Synthroid and check TSH. #8. Diabetes mellitus2 mentioned in the charts but family states she does not have diabetes. #9. History of hypertension - was recently started on triamterene by her cardiologist. At this time we will discontinue triamterene and gently hydrate. #10. Anemia - follow CBC. Further workup as outpatient if there is no significant fall in hemoglobin.  CODE STATUS - full code.    Lameeka Schleifer N. 09/15/2011, 8:25 PM

## 2011-09-15 NOTE — ED Notes (Signed)
Report to [facility] RN with [facility].

## 2011-09-16 ENCOUNTER — Encounter (HOSPITAL_COMMUNITY): Payer: Self-pay | Admitting: Cardiology

## 2011-09-16 DIAGNOSIS — E782 Mixed hyperlipidemia: Secondary | ICD-10-CM

## 2011-09-16 DIAGNOSIS — R112 Nausea with vomiting, unspecified: Secondary | ICD-10-CM

## 2011-09-16 DIAGNOSIS — I4891 Unspecified atrial fibrillation: Secondary | ICD-10-CM

## 2011-09-16 DIAGNOSIS — R740 Nonspecific elevation of levels of transaminase and lactic acid dehydrogenase [LDH]: Secondary | ICD-10-CM

## 2011-09-16 LAB — HEPATITIS PANEL, ACUTE
HCV Ab: NEGATIVE
Hep A IgM: NEGATIVE
Hep B C IgM: NEGATIVE

## 2011-09-16 LAB — COMPREHENSIVE METABOLIC PANEL
ALT: 95 U/L — ABNORMAL HIGH (ref 0–35)
AST: 120 U/L — ABNORMAL HIGH (ref 0–37)
Albumin: 2.9 g/dL — ABNORMAL LOW (ref 3.5–5.2)
Alkaline Phosphatase: 140 U/L — ABNORMAL HIGH (ref 39–117)
BUN: 17 mg/dL (ref 6–23)
Chloride: 96 mEq/L (ref 96–112)
Potassium: 4.1 mEq/L (ref 3.5–5.1)
Sodium: 133 mEq/L — ABNORMAL LOW (ref 135–145)
Total Bilirubin: 0.3 mg/dL (ref 0.3–1.2)
Total Protein: 6.1 g/dL (ref 6.0–8.3)

## 2011-09-16 LAB — URINE CULTURE

## 2011-09-16 LAB — CBC
HCT: 31.2 % — ABNORMAL LOW (ref 36.0–46.0)
MCH: 30.2 pg (ref 26.0–34.0)
MCHC: 34.3 g/dL (ref 30.0–36.0)
MCV: 88.1 fL (ref 78.0–100.0)
Platelets: 186 10*3/uL (ref 150–400)
RDW: 13.4 % (ref 11.5–15.5)

## 2011-09-16 MED ORDER — TRIAMTERENE-HCTZ 37.5-25 MG PO TABS
1.0000 | ORAL_TABLET | Freq: Every day | ORAL | Status: DC
  Administered 2011-09-16 – 2011-09-17 (×2): 1 via ORAL
  Filled 2011-09-16 (×2): qty 1

## 2011-09-16 MED ORDER — BISACODYL 10 MG RE SUPP: 10.0000 mg | Freq: Every day | RECTAL | Status: DC | PRN

## 2011-09-16 MED ORDER — POLYETHYLENE GLYCOL 3350 17 GM/SCOOP PO POWD
1.0000 | Freq: Once | ORAL | Status: AC
  Administered 2011-09-16: 1 via ORAL
  Filled 2011-09-16: qty 255

## 2011-09-16 NOTE — Plan of Care (Signed)
Problem: Phase II Progression Outcomes Goal: Discharge plan established Outcome: Completed/Met Date Met:  09/16/11 To return home with daughter

## 2011-09-16 NOTE — Progress Notes (Signed)
Patient ID: Vanessa Sherman, female   DOB: 1924/07/13, 78 y.o.   MRN: [id number]  [facility] PROGRESS NOTE  Vanessa Sherman WUJ:[id number] DOB: 1924/07/06 DOA: 09/15/2011 PCP: Eartha Inch, MD  Brief narrative: 78 year-old female with history of paroxysmal atrial fibrillation not on Coumadin, hypertension, hypothyroidism and diastolic CHF, memory impairment, was brought to the ER when patient had 3 episodes of nausea vomiting after lunch today. She also complained of abdominal pain mostly in the epigastric area. She was brought to the ER at the [facility]. Over there patient was found to have increased LFTs and the UA was compatible with UTI. Patient at this time is admitted for further management of her nausea vomiting and UTI.  Consultants: [facility]  Procedures: none  Antibiotics: Rocephin  HPI/Subjective:   Objective: Filed Vitals:   09/15/11 1701 09/15/11 2032 09/15/11 2200 09/16/11 0518  BP: 126/73  132/72 129/68  Pulse: 86  71 66  Temp: 99.2 F (37.3 C)  98 F (36.7 C) 98.1 F (36.7 C)  TempSrc: Oral  Oral Oral  Resp: 16  16 17   Height:  5\' 2"  (1.575 m)    Weight:  63 kg (138 lb 14.2 oz)  65.3 kg (143 lb 15.4 oz)  SpO2: 95%  97% 99%    Intake/Output Summary (Last 24 hours) at 09/16/11 1134 Last data filed at 09/16/11 0900  Gross per 24 hour  Intake 1406.67 ml  Output    875 ml  Net 531.67 ml    Exam:   General:  Alert and orientated, NAD, appears comfortable.  Attempting to eat jello.  Cardiovascular: irregularly irregular, no murmurs/rubs/gallops  Respiratory: clear to auscultation, no wheeze or crackles, no accessory muscle use.  Abdomen: Soft, Nt, ND, No obvious masses, decreased bowel sounds.  Extremities:  No edema appreciated.  Able to move all 4 extremities  Neuro:  Non focal.  Data Reviewed: Basic Metabolic Panel:  Lab 09/16/11 9562 09/15/11 1554  NA 133* 133*  K 4.1 3.5  CL 96 93*  CO2 30 27  GLUCOSE 90 129*  BUN  17 23  CREATININE 1.06 1.00  CALCIUM 8.9 9.4  MG -- --  PHOS -- --   Liver Function Tests:  Lab 09/16/11 0530 09/15/11 1554  AST 120* 208*  ALT 95* 110*  ALKPHOS 140* 175*  BILITOT 0.3 0.3  PROT 6.1 7.1  ALBUMIN 2.9* 3.6    Lab 09/15/11 1554  LIPASE 47  AMYLASE --   CBC:  Lab 09/16/11 0530 09/15/11 1554  WBC 12.4* 13.6*  NEUTROABS -- 12.6*  HGB 10.7* 11.3*  HCT 31.2* 32.9*  MCV 88.1 86.8  PLT 186 197     Studies: Dg Pelvis 1-2 Views  09/15/2011  *RADIOLOGY REPORT*  Clinical Data: Right hip pain  PELVIS - 1-2 VIEW  Comparison: Lumbar spine 06/12/2010  Findings: Bilateral total hip arthroplasty using noncemented femoral components with two fixation screws in the right acetabular component and a single fixation screw in the left acetabular component.  Components appear well seated.  No evidence of acute fracture or subluxation.  Visualization of the sacrum and SI joints is limited due to overlying bowel gas and contrast.  IMPRESSION: Bilateral total hip arthroplasties appear well seated.  No acute fractures demonstrated.  Original Report Authenticated By: Marlon Pel, M.D.   Ct Abdomen Pelvis W Contrast  09/16/2011  *RADIOLOGY REPORT*  Clinical Data: 78 year old female with abdominal pain nausea and vomiting.  CT ABDOMEN AND PELVIS WITH CONTRAST  Technique:  Multidetector CT imaging of the abdomen and pelvis was performed following the standard protocol during bolus administration of intravenous contrast.  Contrast: OMNIPAQUE IOHEXOL 300 MG/ML  SOLN  Comparison: Thoracic spine CT 06/12/2010.  Findings: No pericardial or pleural effusion.  Minor dependent opacity at the lung bases is atelectasis.  Osteopenia. Degenerative changes in the spine.  Bilateral hip arthroplasties. No acute osseous abnormality identified.  Streak artifact limits visualization of much of the pelvis due to the bilateral hip implants.  No definite pelvic free fluid or abnormality of the distal  colon.  Visualized uterus within normal limits.  The bladder is only faintly visible.  Oral contrast has reached the splenic flexure.  Normal proximal colon.  Appendix not identified.  No pericecal inflammation is evident.  Visible distal small bowel loops are within normal limits and contain contrast.  No dilated proximal small bowel. Decompressed stomach.  Duodenum within normal limits.  Gallbladder surgically absent.  Pneumobilia, otherwise no significant intrahepatic biliary ductal dilatation.  The CBD also appears within normal limits. Liver enhancement within normal limits.  Negative spleen, pancreas, adrenal glands, and portal venous system.  Ectasia and atherosclerosis of the aorta.  Ectasia and atherosclerosis of the iliac arteries.  Chronic left renal cyst re-identified and stable.  No perinephric stranding, hydronephrosis or hydroureter.  No abdominal free fluid. No lymphadenopathy.  IMPRESSION: 1.  No acute findings identified in the abdomen.   Pneumobilia suggests previous ERCP with sphincterotomy.  Gallbladder surgically absent. 2.  Limited visualization of the pelvis due to bilateral hip implants, with no acute findings identified.  Original Report Authenticated By: Harley Hallmark, M.D.   Dg Chest Portable 1 View  09/15/2011  *RADIOLOGY REPORT*  Clinical Data: Fever.  PORTABLE CHEST - 1 VIEW  Comparison: 12/25/2009  Findings: Peribronchial thickening and interstitial prominence, likely chronic interstitial lung disease.  Heart is normal size. Bibasilar scarring.  No acute infiltrates or effusions.  No acute bony abnormality.  IMPRESSION: Progressive chronic interstitial lung disease.  No acute findings.  Original Report Authenticated By: Cyndie Chime, M.D.    Scheduled Meds:    . sodium chloride   Intravenous Once  . acetaminophen      . amiodarone  200 mg Oral Daily  . cefTRIAXone (ROCEPHIN) IVPB 1 gram/50 mL D5W  1 g Intravenous Once  . cefTRIAXone (ROCEPHIN)  IV  1 g Intravenous Q24H   . iohexol  20 mL Oral Q1 Hr x 2  . levothyroxine  50 mcg Oral Daily  . ondansetron (ZOFRAN) IV  4 mg Intravenous Once  . polyethylene glycol powder  1 Container Oral Once  . DISCONTD: sodium chloride   Intravenous STAT   Continuous Infusions:    . sodium chloride 100 mL/hr at 09/15/11 2050     Assessment/Plan: 1. Nausea / Vomiting:  Patient not tolerating clears well. Pelvic Xray shows constipation.  Will start sipping bowel prep.  CT abd / Pel shows nothing acute.  2.  Elevated LFTs:  Appears she has a history of slightly elevated alk phos and AST dating back to 2011 (so doubt acute hep), but LFTs were higher this admission.  Possibly effects of amiodarone?  Reading Dr Elvis Coil notes, her amiodarone was decreased to 100 mg last year after problems with her thyroid, yet she is currently on the 200 mg dose.  We have asked Cardiology to assess her need for amiodarone, and if she needs it then what is the appropriate dose.Await Hepatitis serology  3.  UTI:  Awaiting cultures.  On IV Rocephin.  4. Right hip pain - patient is complaining of right hip pain. Pelvic xray:  Bilateral total hip arthroplasties appear well seated. No acute  fractures demonstrated.  PT consultation requested.  5. Paroxysmal atrial fibrillation - presently rate controlled. As per cardiologist patient was not felt to be candidate for Coumadin.  Have requested cardiology to evaluate use of amiodarone vs problems with thyroid and possibly Liver function.  6. History of CHF last EF measured was 55 to 60% - presently appears compensated and does not complain of shortness of breath.   7. Hypothyroidism - continue Synthroid   8. History of hypertension - was recently started on triamterene by cardiology. Resume Anti-hypertensive meds  9. Normocytic Anemia - follow CBC. Further workup as outpatient if there is no significant fall in hemoglobin.   Code Status: Full Code Family Communication: Left message at the  daughter's home that patient was stable and please call for an update. Disposition Plan: To be determined.  Likely d/c to home in daughter's care 6/26.   [place]ley [place] [facility] Pager [phone]  Attending I have seen and examined the patient. I agree with the assessment and plan as outlined above. She does have elevated LFT's and interstitial changes on the CXR. Consult Cards to see if amiodarone is still appropriate. Advance diet as tolerated.  S Anella Nakata  If 7PM-7AM, please contact night-coverage [email or web address] Password [facility] 09/16/2011, 11:34 AM   LOS: 1 day

## 2011-09-16 NOTE — Progress Notes (Signed)
Physical Therapy Treatment Patient Details Name: Vanessa Sherman MRN: [id number] DOB: [birth year] Today's Date: 09/16/2011 Time: 4098-1191 PT Time Calculation (min): 25 min  PT Assessment / Plan / Recommendation Comments on Treatment Session       Follow Up Recommendations  Home health PT;Supervision/Assistance - 24 hour    Barriers to Discharge        Equipment Recommendations  Tub/shower seat    Recommendations for Other Services    Frequency Min 3X/week   Plan      Precautions / Restrictions Precautions Precautions: Fall (slight fall risk)   Pertinent Vitals/Pain no apparent distress     Mobility  Bed Mobility Bed Mobility: Supine to Sit;Sitting - Scoot to Edge of Bed Supine to Sit: 4: Min assist;With rails Sitting - Scoot to [place] of Bed: 4: Min assist;With rail Details for Bed Mobility Assistance: Cues to initiate and for technique Transfers Transfers: Sit to Stand;Stand to Sit Sit to Stand: 4: Min assist;From bed;With upper extremity assist Stand to Sit: 4: Min assist;With armrests;With upper extremity assist;To chair/3-in-1 Details for Transfer Assistance: cues for safety, hand placement, and to control descent Ambulation/Gait Ambulation/Gait Assistance: 4: Min assist Ambulation Distance (Feet): 60 Feet Assistive device: Rolling walker Ambulation/Gait Assistance Details: Cues for posture Gait Pattern: Decreased stride length Gait velocity: slow    Exercises     PT Diagnosis: Generalized weakness  PT Problem List: Decreased strength;Decreased activity tolerance;Decreased knowledge of use of DME PT Treatment Interventions: DME instruction;Gait training;Stair training;Functional mobility training;Therapeutic activities;Therapeutic exercise;Patient/family education   PT Goals Acute Rehab PT Goals PT Goal Formulation: With patient Time For Goal Achievement: 09/16/11 Potential to Achieve Goals: Good Pt will go Supine/Side to Sit: with modified independence PT  Goal: Supine/Side to Sit - Progress: Goal set today Pt will go Sit to Supine/Side: with modified independence PT Goal: Sit to Supine/Side - Progress: Goal set today Pt will go Sit to Stand: with modified independence PT Goal: Sit to Stand - Progress: Goal set today Pt will go Stand to Sit: with modified independence PT Goal: Stand to Sit - Progress: Goal set today Pt will Ambulate: >150 feet;with supervision;with least restrictive assistive device PT Goal: Ambulate - Progress: Goal set today  Visit Information  Last PT Received On: 09/16/11 Assistance Needed: +1    Subjective Data  Subjective: Agreeableto amb Patient Stated Goal: Feel better   Cognition  Overall Cognitive Status: Appears within functional limits for tasks assessed/performed Arousal/Alertness: Awake/alert Orientation Level: Appears intact for tasks assessed Behavior During Session: [facility] for tasks performed    Balance     End of Session PT - End of Session Equipment Utilized During Treatment: Gait belt Activity Tolerance: Patient tolerated treatment well Patient left: in chair;with call bell/phone within reach Nurse Communication: Mobility status    Olen Pel [place], [place] [phone]  09/16/2011, 1:35 PM

## 2011-09-16 NOTE — Care Management Note (Signed)
    Page 1 of 2   09/17/2011     11:27:25 AM   CARE MANAGEMENT NOTE 09/17/2011  Patient:  Vanessa Sherman, Vanessa Sherman   Account Number:  [id number]  Date Initiated:  09/16/2011  Documentation initiated by:  Letha Cape  Subjective/Objective Assessment:   dx n/v  abd pain, left hip pain,  UTI, chronic afib  admit- lives with daughter, uses rolling walker and w/chair at home.     Action/Plan:   6/25 ct of abd and check hepatitis panel  pt eval-out pt physical therapy   Anticipated DC Date:  09/17/2011   Anticipated DC Plan:  HOME/SELF CARE      DC Planning Services  CM consult      Choice offered to / List presented to:             Status of service:  Completed, signed off Medicare Important Message given?   (If response is "NO", the following Medicare IM given date fields will be blank) Date Medicare IM given:   Date Additional Medicare IM given:    Discharge Disposition:  HOME/SELF CARE  Per UR Regulation:  Reviewed for med. necessity/level of care/duration of stay  If discussed at Long Length of Stay Meetings, dates discussed:    Comments:  09/17/11 10:59 Letha Cape RN, BSN [phone] spoke to [place], the physical therapist about patient getting outp pt pt , she states this is fine.  Faxed referral over to Neuro outpt physical therapy.  Spoke with Vanessa Sherman, patient 's daughter informed her that patient is for dc today and that the outpt rehab will call her with schedule time for  out pt pt.  Vanessa Sherman states that her air condition gave out in her home and she wanted to know if there was a place patient could stay for a week  or two until this is fixed, I informed her that maybe she could stay in a hotel, she states this is too expensive.  Vanessa Sherman states she will be here to pick patient up today.  09/16/11 10:42 Letha Cape RN, BSN [phone] patient lives with daughter, patient has a walker and a w/chair at home.  Patient's daughter is Vanessa Sherman and her cell phone is [phone].  Await pt eval.   NCM will continue to follow for dc needs.  Per pt eval recs hhpt and tub shower seat, per daughter, Vanessa Sherman, she states patient has a tub seat and they would rather have outpt pt rather than hhpt, I will check with therapist to see how they feel about pt for outpt pt.

## 2011-09-16 NOTE — Consult Note (Signed)
HPI: 78 year old female with past medical history of paroxysmal atrial fibrillation for evaluation of this problem with elevated liver functions. Patient has been followed by Dr. Swaziland. Last echocardiogram in October of 2011 showed an ejection fraction of 55-65% and moderate mitral regurgitation. She has been maintained on amiodarone therapy but has felt not to be a Coumadin candidate. 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 Patient admitted on June 24 with complaints of nausea and vomiting and epigastric discomfort. There was no melena or hematochezia. Her liver functions have been mildly elevated and her urinalysis showed a UTI and she is being treated. She now feels better. Cardiology is asked to evaluate for her amiodarone and increased liver functions. She denies dyspnea, chest pain, palpitations or syncope.  Medications Prior to Admission  Medication Sig Dispense Refill  . amiodarone (PACERONE) 200 MG tablet Take 1 tablet (200 mg total) by mouth daily.  30 tablet  6  . levothyroxine (SYNTHROID, LEVOTHROID) 50 MCG tablet Take 50 mcg by mouth daily.        Marland Kitchen triamterene-hydrochlorothiazide (MAXZIDE-25) 37.5-25 MG per tablet Take 1 each (1 tablet total) by mouth daily.  30 tablet  11    No Known Allergies  Past Medical History  Diagnosis Date  . Diabetes mellitus     TYPE 2  . Hypertension   . Hearing loss   . Glaucoma   . PAF (paroxysmal atrial fibrillation)     EF IS 55-60%  . Hypothyroid   . Gait instability   . Fibula fracture   . Osteoarthritis   . Anemia   . Memory impairment     Mild  . Gastroenteritis   . COPD (chronic obstructive pulmonary disease)     Past Surgical History  Procedure Date  . Cholecystectomy   . Appendectomy   . Tonsillectomy   . Total hip arthroplasty     bilateral    History   Social History  . Marital Status: Single    Spouse Name: N/A    Number of Children: 1  . Years of Education: N/A    Occupational History  .     Social History Main Topics  . Smoking status: Former Smoker    Quit date: 07/24/1995  . Smokeless tobacco: Not on file  . Alcohol Use: Yes     Rarely  . Drug Use: No  . Sexually Active: Not on file   Other Topics Concern  . Not on file   Social History Narrative   Originally from [place]    History reviewed. No pertinent family history.  ROS:  Patient does describe recent nausea and vomiting as well as frequency but no fevers or chills, productive cough, hemoptysis, dysphasia, odynophagia, melena, hematochezia, dysuria, hematuria, rash, seizure activity, orthopnea, PND, pedal edema, claudication. Remaining systems are negative.  Physical Exam:   Blood pressure 129/68, pulse 66, temperature 98.1 F (36.7 C), temperature source Oral, resp. rate 17, height 5\' 2"  (1.575 m), weight 65.3 kg (143 lb 15.4 oz), SpO2 99.00%.  General:  Well developed/well nourished in NAD Skin warm/dry Patient not depressed No peripheral clubbing Back-normal HEENT-normal/normal eyelids Neck supple/normal carotid upstroke bilaterally; no bruits; no JVD; no thyromegaly chest - CTA/ normal expansion CV - RRR/normal S1 and S2; no murmurs, rubs or gallops;  PMI nondisplaced Abdomen -NT/ND, no HSM, no mass, + bowel sounds, no bruit 2+ femoral pulses, no bruits Ext-no edema, chords, distal pulses diminished Neuro-grossly nonfocal  ECG sinus rhythm with nonspecific  ST changes. Occasional PVC.  Results for orders placed during the hospital encounter of 09/15/11 (from the past 48 hour(s))  URINALYSIS, ROUTINE W REFLEX MICROSCOPIC     Status: Abnormal   Collection Time   09/15/11  3:46 PM      Component Value Range Comment   Color, Urine YELLOW  YELLOW    APPearance CLEAR  CLEAR    Specific Gravity, Urine 1.018  1.005 - 1.030    pH 5.0  5.0 - 8.0    Glucose, UA NEGATIVE  NEGATIVE mg/dL    Hgb urine dipstick SMALL (*) NEGATIVE    Bilirubin Urine NEGATIVE  NEGATIVE     Ketones, ur NEGATIVE  NEGATIVE mg/dL    Protein, ur NEGATIVE  NEGATIVE mg/dL    Urobilinogen, UA 0.2  0.0 - 1.0 mg/dL    Nitrite POSITIVE (*) NEGATIVE    Leukocytes, UA SMALL (*) NEGATIVE   URINE MICROSCOPIC-ADD ON     Status: Abnormal   Collection Time   09/15/11  3:46 PM      Component Value Range Comment   Squamous Epithelial / LPF RARE  RARE    WBC, UA 3-6  <3 WBC/hpf    RBC / HPF 0-2  <3 RBC/hpf    Bacteria, UA MANY (*) RARE    Urine-Other LESS THAN 10 mL OF URINE SUBMITTED   MICROSCOPIC EXAM PERFORMED ON UNCONCENTRATED URINE  URINE CULTURE     Status: Normal (Preliminary result)   Collection Time   09/15/11  3:46 PM      Component Value Range Comment   Specimen Description URINE, CATHETERIZED      Special Requests NONE      Culture  Setup Time [id number]      Colony Count 95,000 COLONIES/ML      Culture ESCHERICHIA COLI      Report Status PENDING     CBC     Status: Abnormal   Collection Time   09/15/11  3:54 PM      Component Value Range Comment   WBC 13.6 (*) 4.0 - 10.5 K/uL    RBC 3.79 (*) 3.87 - 5.11 MIL/uL    Hemoglobin 11.3 (*) 12.0 - 15.0 g/dL    HCT 81.1 (*) 91.4 - 46.0 %    MCV 86.8  78.0 - 100.0 fL    MCH 29.8  26.0 - 34.0 pg    MCHC 34.3  30.0 - 36.0 g/dL    RDW 78.2  95.6 - 21.3 %    Platelets 197  150 - 400 K/uL   DIFFERENTIAL     Status: Abnormal   Collection Time   09/15/11  3:54 PM      Component Value Range Comment   Neutrophils Relative 93 (*) 43 - 77 %    Neutro Abs 12.6 (*) 1.7 - 7.7 K/uL    Lymphocytes Relative 2 (*) 12 - 46 %    Lymphs Abs 0.3 (*) 0.7 - 4.0 K/uL    Monocytes Relative 5  3 - 12 %    Monocytes Absolute 0.7  0.1 - 1.0 K/uL    Eosinophils Relative 0  0 - 5 %    Eosinophils Absolute 0.0  0.0 - 0.7 K/uL    Basophils Relative 0  0 - 1 %    Basophils Absolute 0.0  0.0 - 0.1 K/uL   COMPREHENSIVE METABOLIC PANEL     Status: Abnormal   Collection Time   09/15/11  3:54 PM  Component Value Range Comment   Sodium 133 (*) 135 - 145  mEq/L    Potassium 3.5  3.5 - 5.1 mEq/L    Chloride 93 (*) 96 - 112 mEq/L    CO2 27  19 - 32 mEq/L    Glucose, Bld 129 (*) 70 - 99 mg/dL    BUN 23  6 - 23 mg/dL    Creatinine, Ser 4.09  0.50 - 1.10 mg/dL    Calcium 9.4  8.4 - 81.1 mg/dL    Total Protein 7.1  6.0 - 8.3 g/dL    Albumin 3.6  3.5 - 5.2 g/dL    AST 914 (*) 0 - 37 U/L    ALT 110 (*) 0 - 35 U/L    Alkaline Phosphatase 175 (*) 39 - 117 U/L    Total Bilirubin 0.3  0.3 - 1.2 mg/dL    GFR calc non Af Amer 50 (*) >90 mL/min    GFR calc Af Amer 58 (*) >90 mL/min   LIPASE, BLOOD     Status: Normal   Collection Time   09/15/11  3:54 PM      Component Value Range Comment   Lipase 47  11 - 59 U/L   LACTIC ACID, PLASMA     Status: Abnormal   Collection Time   09/15/11  8:29 PM      Component Value Range Comment   Lactic Acid, Venous 4.0 (*) 0.5 - 2.2 mmol/L   PROTIME-INR     Status: Normal   Collection Time   09/15/11  8:29 PM      Component Value Range Comment   Prothrombin Time 13.1  11.6 - 15.2 seconds    INR 0.97  0.00 - 1.49   COMPREHENSIVE METABOLIC PANEL     Status: Abnormal   Collection Time   09/16/11  5:30 AM      Component Value Range Comment   Sodium 133 (*) 135 - 145 mEq/L    Potassium 4.1  3.5 - 5.1 mEq/L    Chloride 96  96 - 112 mEq/L    CO2 30  19 - 32 mEq/L    Glucose, Bld 90  70 - 99 mg/dL    BUN 17  6 - 23 mg/dL    Creatinine, Ser 7.82  0.50 - 1.10 mg/dL    Calcium 8.9  8.4 - 95.6 mg/dL    Total Protein 6.1  6.0 - 8.3 g/dL    Albumin 2.9 (*) 3.5 - 5.2 g/dL    AST 213 (*) 0 - 37 U/L    ALT 95 (*) 0 - 35 U/L    Alkaline Phosphatase 140 (*) 39 - 117 U/L    Total Bilirubin 0.3  0.3 - 1.2 mg/dL    GFR calc non Af Amer 47 (*) >90 mL/min    GFR calc Af Amer 54 (*) >90 mL/min   CBC     Status: Abnormal   Collection Time   09/16/11  5:30 AM      Component Value Range Comment   WBC 12.4 (*) 4.0 - 10.5 K/uL    RBC 3.54 (*) 3.87 - 5.11 MIL/uL    Hemoglobin 10.7 (*) 12.0 - 15.0 g/dL    HCT 08.6 (*) 57.8 -  46.0 %    MCV 88.1  78.0 - 100.0 fL    MCH 30.2  26.0 - 34.0 pg    MCHC 34.3  30.0 - 36.0 g/dL    RDW 46.9  62.9 -  15.5 %    Platelets 186  150 - 400 K/uL   TSH     Status: Normal   Collection Time   09/16/11  5:30 AM      Component Value Range Comment   TSH 1.776  0.350 - 4.500 uIU/mL     Dg Pelvis 1-2 Views  09/15/2011  *RADIOLOGY REPORT*  Clinical Data: Right hip pain  PELVIS - 1-2 VIEW  Comparison: Lumbar spine 06/12/2010  Findings: Bilateral total hip arthroplasty using noncemented femoral components with two fixation screws in the right acetabular component and a single fixation screw in the left acetabular component.  Components appear well seated.  No evidence of acute fracture or subluxation.  Visualization of the sacrum and SI joints is limited due to overlying bowel gas and contrast.  IMPRESSION: Bilateral total hip arthroplasties appear well seated.  No acute fractures demonstrated.  Original Report Authenticated By: Marlon Pel, M.D.   Ct Abdomen Pelvis W Contrast  09/16/2011  *RADIOLOGY REPORT*  Clinical Data: 78 year old female with abdominal pain nausea and vomiting.  CT ABDOMEN AND PELVIS WITH CONTRAST  Technique:  Multidetector CT imaging of the abdomen and pelvis was performed following the standard protocol during bolus administration of intravenous contrast.  Contrast: OMNIPAQUE IOHEXOL 300 MG/ML  SOLN  Comparison: Thoracic spine CT 06/12/2010.  Findings: No pericardial or pleural effusion.  Minor dependent opacity at the lung bases is atelectasis.  Osteopenia. Degenerative changes in the spine.  Bilateral hip arthroplasties. No acute osseous abnormality identified.  Streak artifact limits visualization of much of the pelvis due to the bilateral hip implants.  No definite pelvic free fluid or abnormality of the distal colon.  Visualized uterus within normal limits.  The bladder is only faintly visible.  Oral contrast has reached the splenic flexure.  Normal proximal  colon.  Appendix not identified.  No pericecal inflammation is evident.  Visible distal small bowel loops are within normal limits and contain contrast.  No dilated proximal small bowel. Decompressed stomach.  Duodenum within normal limits.  Gallbladder surgically absent.  Pneumobilia, otherwise no significant intrahepatic biliary ductal dilatation.  The CBD also appears within normal limits. Liver enhancement within normal limits.  Negative spleen, pancreas, adrenal glands, and portal venous system.  Ectasia and atherosclerosis of the aorta.  Ectasia and atherosclerosis of the iliac arteries.  Chronic left renal cyst re-identified and stable.  No perinephric stranding, hydronephrosis or hydroureter.  No abdominal free fluid. No lymphadenopathy.  IMPRESSION: 1.  No acute findings identified in the abdomen.   Pneumobilia suggests previous ERCP with sphincterotomy.  Gallbladder surgically absent. 2.  Limited visualization of the pelvis due to bilateral hip implants, with no acute findings identified.  Original Report Authenticated By: Harley Hallmark, M.D.   Dg Chest Portable 1 View  09/15/2011  *RADIOLOGY REPORT*  Clinical Data: Fever.  PORTABLE CHEST - 1 VIEW  Comparison: 12/25/2009  Findings: Peribronchial thickening and interstitial prominence, likely chronic interstitial lung disease.  Heart is normal size. Bibasilar scarring.  No acute infiltrates or effusions.  No acute bony abnormality.  IMPRESSION: Progressive chronic interstitial lung disease.  No acute findings.  Original Report Authenticated By: Cyndie Chime, M.D.    Assessment/Plan #1-paroxysmal atrial fibrillation-the patient remains in sinus rhythm. She is felt not to be a Coumadin candidate because of gait instability. Would add aspirin at discharge. I do not think her recent nausea and vomiting is related to her amiodarone as this was fairly acute in onset. I  also doubt her liver functions are elevated from amiodarone although it could  certainly contribute. Note her LFTs are already improving on followup labs. I am concerned about her chest x-ray which shows progressive chronic interstitial lung disease. This certainly could be related to amiodarone. I will discontinue this medication for now and Dr. Swaziland will review tomorrow morning. She may need a different antiarrhythmic. #2-increased liver functions-doubt related to amiodarone as her LFTs are improving on followup laboratories. This appears to be an acute event and may be viral in etiology. We are also discontinuing her amiodarone for now. She has had prior cholecystectomy. #3-UTI-management per primary care. #4-diabetes mellitus #5-hypertension-would resume antihypertensives at discharge.  Olga Millers MD 09/16/2011, 12:53 PM

## 2011-09-17 DIAGNOSIS — R740 Nonspecific elevation of levels of transaminase and lactic acid dehydrogenase [LDH]: Secondary | ICD-10-CM

## 2011-09-17 DIAGNOSIS — E782 Mixed hyperlipidemia: Secondary | ICD-10-CM

## 2011-09-17 DIAGNOSIS — N390 Urinary tract infection, site not specified: Principal | ICD-10-CM

## 2011-09-17 DIAGNOSIS — R112 Nausea with vomiting, unspecified: Secondary | ICD-10-CM

## 2011-09-17 DIAGNOSIS — I4891 Unspecified atrial fibrillation: Secondary | ICD-10-CM

## 2011-09-17 DIAGNOSIS — J849 Interstitial pulmonary disease, unspecified: Secondary | ICD-10-CM | POA: Diagnosis present

## 2011-09-17 LAB — HEPATIC FUNCTION PANEL
AST: 90 U/L — ABNORMAL HIGH (ref 0–37)
Albumin: 3.3 g/dL — ABNORMAL LOW (ref 3.5–5.2)
Alkaline Phosphatase: 182 U/L — ABNORMAL HIGH (ref 39–117)
Bilirubin, Direct: 0.1 mg/dL (ref 0.0–0.3)
Total Bilirubin: 0.4 mg/dL (ref 0.3–1.2)

## 2011-09-17 LAB — COMPREHENSIVE METABOLIC PANEL
AST: 82 U/L — ABNORMAL HIGH (ref 0–37)
Albumin: 2.9 g/dL — ABNORMAL LOW (ref 3.5–5.2)
Alkaline Phosphatase: 160 U/L — ABNORMAL HIGH (ref 39–117)
BUN: 7 mg/dL (ref 6–23)
Chloride: 97 mEq/L (ref 96–112)
Potassium: 3.7 mEq/L (ref 3.5–5.1)
Total Bilirubin: 0.3 mg/dL (ref 0.3–1.2)
Total Protein: 6.5 g/dL (ref 6.0–8.3)

## 2011-09-17 LAB — CBC
HCT: 31.9 % — ABNORMAL LOW (ref 36.0–46.0)
Hemoglobin: 10.9 g/dL — ABNORMAL LOW (ref 12.0–15.0)
MCV: 87.6 fL (ref 78.0–100.0)
RDW: 13.2 % (ref 11.5–15.5)
WBC: 7.3 10*3/uL (ref 4.0–10.5)

## 2011-09-17 LAB — TSH: TSH: 3.511 u[IU]/mL (ref 0.350–4.500)

## 2011-09-17 MED ORDER — ASPIRIN EC 81 MG PO TBEC: 81.0000 mg | DELAYED_RELEASE_TABLET | Freq: Every day | ORAL | Status: AC

## 2011-09-17 MED ORDER — CIPROFLOXACIN HCL 500 MG PO TABS: 500.0000 mg | ORAL_TABLET | Freq: Two times a day (BID) | ORAL | Status: AC

## 2011-09-17 MED ORDER — SODIUM CHLORIDE 0.9 % IV SOLN
INTRAVENOUS | Status: AC
  Administered 2011-09-17: 11:00:00 via INTRAVENOUS

## 2011-09-17 NOTE — Discharge Summary (Signed)
Patient ID: Vanessa Sherman MRN: [id number] DOB/AGE: [birth year] 78 y.o.  Admit date: 09/15/2011 Discharge date: 09/17/2011  Primary Care Physician:  Eartha Inch, MD  Discharge Diagnoses Present on Admission:  Principal Problem:  *Nausea & vomiting  Active Problems:   UTI (lower urinary tract infection)  Elevated liver enzymes  Interstitial lung disease  Hypertension  PAF (paroxysmal atrial fibrillation)  Abdominal pain  Medication List  As of 09/17/2011  1:53 PM   STOP taking these medications         amiodarone 200 MG tablet         TAKE these medications         aspirin EC 81 MG tablet   Take 1 tablet (81 mg total) by mouth daily.      ciprofloxacin 500 MG tablet   Commonly known as: CIPRO   Take 1 tablet (500 mg total) by mouth 2 (two) times daily.      levothyroxine 50 MCG tablet   Commonly known as: SYNTHROID, LEVOTHROID   Take 50 mcg by mouth daily.      triamterene-hydrochlorothiazide 37.5-25 MG per tablet   Commonly known as: MAXZIDE-25   Take 1 each (1 tablet total) by mouth daily.            Consults:  [facility]  Brief H and P: From the admission note:  Chief Complaint: Nausea vomiting and abdominal pain.  78 year-old female with history of paroxysmal atrial fibrillation not on Coumadin, hypertension, hypothyroidism and diastolic CHF was brought to the ER patient had 3 episodes of nausea vomiting after lunch today. She also complained of abdominal pain mostly in the epigastric area. She was brought to the ER at the [facility]. Over there patient was found to have increased LFTs and the UA was compatible with UTI. Patient at this time is admitted for further management of her nausea vomiting and UTI. At this time when I examined the patient patient denies any abdominal pain but still has poor appetite and does not want to eat anything. She did not have any further episodes of nausea vomiting after admission. Patient otherwise complains  of right hip pain though daughter states she did not have any fall. Patient denies any chest pain shortness of breath. She did not have any diarrhea or dysuria. As per patient's daughter prior to today's lunch she had no problem.   Hospital Course:   Nausea & vomiting - resolved.  Possibly due to viral gastroenteritis or possibly due to severe constipation.  Patient received a bowel prep and had numerous bowel movements.  She is no longer having abdominal pain or vomiting and is tolerating solid food.   UTI (lower urinary tract infection) - urine culture showed 95,000 colonies of e-coli, pan sensitive.  The patient received 3 days of IV Rocephin in house and will be discharged to home with another three days of Cipro PO.  Per her daughter, the patient has recurrent UTIs.  Elevated liver enzymes.  Patient's LFTs were noted to be elevated on admission.  Specifically (AST 208, ALT 110).  They trended down during the course of the hospitalization.  The patient appears to have had mildly elevated transaminases in the past.  Cardiology was consulted to determine if Amiodarone was contributing to the elevated LFTs.  Interstitial lung disease.  The patient was noted to have progressive interstitial lung disease on admission xray.  She appears to be asymptomatic.  Cardiology was concerned that amiodarone may be contributing to this  condition and consequently, they have discontinued it.  They started her on an 81 mg aspirin daily.  PAF.  Not a coumadin candidate due to falls.  Patient was on amiodarone, stopped this admission by cardiology due to history of abnormal thyroid function, Interstitial Lung Disease, and elevated liver enzymes.  The patient will be discharged on an 81 mg asa and cardiology follow up.  Hypertension.  Stable on previous home medications.  Mobility.  The patient complained of right hip pain on admission.  No acute cause was found.  Physical therapy worked with her and recommended out  patient physical therapy follow up.  This is being arranged by case management.   Physical Exam on Discharge: General: Alert, awake, oriented x3, in no acute distress. Sitting in chair eating cake.  HEENT: No bruits, no goiter. Heart: regular rate and irregular rhythm, without murmurs, rubs, gallops. Lungs: Clear to auscultation bilaterally. Abdomen: Soft, nontender, nondistended, positive bowel sounds. Extremities: No clubbing cyanosis or edema with positive pedal pulses. Neuro: Grossly intact, nonfocal.  Filed Vitals:   09/16/11 0518 09/16/11 1500 09/16/11 2215 09/17/11 0619  BP: 129/68 121/70 119/60 167/88  Pulse: 66 68 64 64  Temp: 98.1 F (36.7 C) 98.1 F (36.7 C) 98.4 F (36.9 C) 98.3 F (36.8 C)  TempSrc: Oral Oral Oral Oral  Resp: 17 18 20 20   Height:      Weight: 65.3 kg (143 lb 15.4 oz)   63.2 kg (139 lb 5.3 oz)  SpO2: 99% 99% 98% 99%     Intake/Output Summary (Last 24 hours) at 09/17/11 1353 Last data filed at 09/17/11 0330  Gross per 24 hour  Intake    150 ml  Output    250 ml  Net   -100 ml    Basic Metabolic Panel:  Lab 09/17/11 1610 09/16/11 0530  NA 135 133*  K 3.7 4.1  CL 97 96  CO2 26 30  GLUCOSE 91 90  BUN 7 17  CREATININE 0.91 1.06  CALCIUM 9.1 8.9  MG -- --  PHOS -- --   Liver Function Tests:  Lab 09/17/11 0835 09/17/11 0605  AST 90* 82*  ALT 94* 82*  ALKPHOS 182* 160*  BILITOT 0.4 0.3  PROT 7.4 6.5  ALBUMIN 3.3* 2.9*    Lab 09/15/11 1554  LIPASE 47  AMYLASE --   CBC:  Lab 09/17/11 0605 09/16/11 0530 09/15/11 1554  WBC 7.3 12.4* --  NEUTROABS -- -- 12.6*  HGB 10.9* 10.7* --  HCT 31.9* 31.2* --  MCV 87.6 88.1 --  PLT 186 186 --   Thyroid Function Tests:  Lab 09/17/11 0605  TSH 3.511  T4TOTAL --  FREET4 --  T3FREE --  THYROIDAB --   Coagulation:  Lab 09/15/11 2029  LABPROT 13.1  INR 0.97     Significant Diagnostic Studies:  Dg Pelvis 1-2 Views  09/15/2011  *RADIOLOGY REPORT*  Clinical Data: Right hip  pain  PELVIS - 1-2 VIEW  Comparison: Lumbar spine 06/12/2010  Findings: Bilateral total hip arthroplasty using noncemented femoral components with two fixation screws in the right acetabular component and a single fixation screw in the left acetabular component.  Components appear well seated.  No evidence of acute fracture or subluxation.  Visualization of the sacrum and SI joints is limited due to overlying bowel gas and contrast.  IMPRESSION: Bilateral total hip arthroplasties appear well seated.  No acute fractures demonstrated.  Original Report Authenticated By: Marlon Pel, M.D.   Ct Abdomen  Pelvis W Contrast  09/16/2011  *RADIOLOGY REPORT*  Clinical Data: 78 year old female with abdominal pain nausea and vomiting.  CT ABDOMEN AND PELVIS WITH CONTRAST  Technique:  Multidetector CT imaging of the abdomen and pelvis was performed following the standard protocol during bolus administration of intravenous contrast.  Contrast: OMNIPAQUE IOHEXOL 300 MG/ML  SOLN  Comparison: Thoracic spine CT 06/12/2010.  Findings: No pericardial or pleural effusion.  Minor dependent opacity at the lung bases is atelectasis.  Osteopenia. Degenerative changes in the spine.  Bilateral hip arthroplasties. No acute osseous abnormality identified.  Streak artifact limits visualization of much of the pelvis due to the bilateral hip implants.  No definite pelvic free fluid or abnormality of the distal colon.  Visualized uterus within normal limits.  The bladder is only faintly visible.  Oral contrast has reached the splenic flexure.  Normal proximal colon.  Appendix not identified.  No pericecal inflammation is evident.  Visible distal small bowel loops are within normal limits and contain contrast.  No dilated proximal small bowel. Decompressed stomach.  Duodenum within normal limits.  Gallbladder surgically absent.  Pneumobilia, otherwise no significant intrahepatic biliary ductal dilatation.  The CBD also appears within  normal limits. Liver enhancement within normal limits.  Negative spleen, pancreas, adrenal glands, and portal venous system.  Ectasia and atherosclerosis of the aorta.  Ectasia and atherosclerosis of the iliac arteries.  Chronic left renal cyst re-identified and stable.  No perinephric stranding, hydronephrosis or hydroureter.  No abdominal free fluid. No lymphadenopathy.  IMPRESSION: 1.  No acute findings identified in the abdomen.   Pneumobilia suggests previous ERCP with sphincterotomy.  Gallbladder surgically absent. 2.  Limited visualization of the pelvis due to bilateral hip implants, with no acute findings identified.  Original Report Authenticated By: Harley Hallmark, M.D.   Dg Chest Portable 1 View  09/15/2011  *RADIOLOGY REPORT*  Clinical Data: Fever.  PORTABLE CHEST - 1 VIEW  Comparison: 12/25/2009  Findings: Peribronchial thickening and interstitial prominence, likely chronic interstitial lung disease.  Heart is normal size. Bibasilar scarring.  No acute infiltrates or effusions.  No acute bony abnormality.  IMPRESSION: Progressive chronic interstitial lung disease.  No acute findings.  Original Report Authenticated By: Cyndie Chime, M.D.      Disposition and Follow-up:  Stable for discharge to home in the care of her daughter.  Discharge Orders    Future Appointments: Provider: Department: Dept Phone: Center:   10/02/2011 10:15 AM Rosalio Macadamia, NP [facility] [phone] None   10/02/2011 10:30 AM [facility] [phone] [facility]     Future Orders Please Complete By Expires   Diet - low sodium heart healthy      Increase activity slowly        Follow-up Information    Follow up with Peter Swaziland, MD. Schedule an appointment as soon as possible for a visit in 2 weeks.   Contact information:   [address] [place] [address] [phone]       Follow up with Eartha Inch, MD in 1 week.   Contact  information:   [address] [place] [address] [phone]         AT PCP FOLLOW UP:  PLEASE CHECK LFTS TO ENSURE THEY ARE CONTINUING TO DECLINE.  Amiodarone was discontinued due to interstitial lung disease (on cxr), and elevated liver function tests.   Time spent on Discharge: 40 min.  Signed: Stephani Police  09/17/2011, 1:53 PM [phone]

## 2011-09-17 NOTE — Discharge Instructions (Addendum)
Please strive to keep bowel movements soft (using miralax stool softener daily if necessary) and have a daily bowel movement.  Every day or every other day is fine.

## 2011-09-17 NOTE — Progress Notes (Signed)
Physical Therapy Eval (late entry for eval conducted on 6/25)  Past Medical History  Diagnosis Date  . Diabetes mellitus     TYPE 2  . Hypertension   . Hearing loss   . Glaucoma   . PAF (paroxysmal atrial fibrillation)     EF IS 55-60%  . Hypothyroid   . Gait instability   . Fibula fracture   . Osteoarthritis   . Anemia   . Memory impairment     Mild  . Gastroenteritis   . COPD (chronic obstructive pulmonary disease)    Past Surgical History  Procedure Date  . Cholecystectomy   . Appendectomy   . Tonsillectomy   . Total hip arthroplasty     bilateral     09/16/11 1314  PT Visit Information  Last PT Received On 09/16/11  Assistance Needed +1  PT Time Calculation  PT Start Time 1107  PT Stop Time 1132  PT Time Calculation (min) 25 min  Subjective Data  Subjective Agreeableto amb  Patient Stated Goal Feel better  Precautions  Precautions Fall (slight fall risk)  Home Living  Lives With Family  Available Help at Discharge Family;Available 24 hours/day  Type of Home House  Home Access Level entry  Home Layout One level  Bathroom Shower/Tub Tub/shower unit  Home Adaptive Equipment Walker - rolling;Wheelchair - manual  Prior Function  Level of Independence Independent with assistive device(s)  Communication  Communication HOH  Cognition  Overall Cognitive Status Appears within functional limits for tasks assessed/performed  Arousal/Alertness Awake/alert  Orientation Level Appears intact for tasks assessed  Behavior During Session [facility] for tasks performed  Right Upper Extremity Assessment  RUE ROM/Strength/Tone [facility] for tasks assessed  Left Upper Extremity Assessment  LUE ROM/Strength/Tone WFL for tasks assessed  Right Lower Extremity Assessment  RLE ROM/Strength/Tone Deficits  RLE ROM/Strength/Tone Deficits Generalized weakness, with dependence on UE push for sit to/from stand  Left Lower Extremity Assessment  LLE ROM/Strength/Tone Deficits  LLE  ROM/Strength/Tone Deficits Generalized weakness, with dependence on UE push for sit to/from stand  Bed Mobility  Bed Mobility Supine to Sit;Sitting - Scoot to Edge of Bed  Supine to Sit 4: Min assist;With rails  Sitting - Scoot to [place] of Bed 4: Min assist;With rail  Details for Bed Mobility Assistance Cues to initiate and for technique  Transfers  Transfers Sit to Stand;Stand to Sit  Sit to Stand 4: Min assist;From bed;With upper extremity assist  Stand to Sit 4: Min assist;With armrests;With upper extremity assist;To chair/3-in-1  Details for Transfer Assistance cues for safety, hand placement, and to control descent  Ambulation/Gait  Ambulation/Gait Assistance 4: Min assist  Ambulation Distance (Feet) 60 Feet  Assistive device Rolling walker  Ambulation/Gait Assistance Details Cues for posture  Gait Pattern Decreased stride length  Gait velocity slow  PT - End of Session  Equipment Utilized During Treatment Gait belt  Activity Tolerance Patient tolerated treatment well  Patient left in chair;with call bell/phone within reach  Nurse Communication Mobility status  PT Assessment  Clinical Impression Statement 78 yo female admitted with Nausea/Vomiting presents with some decr activity tol and decr functional mobility; will benefit from PT to maximize independence and safety with mobility in prep for dc home  PT Recommendation/Assessment Patient needs continued PT services  PT Problem List Decreased strength;Decreased activity tolerance;Decreased knowledge of use of DME  PT Therapy Diagnosis  Generalized weakness  PT Plan  PT Frequency Min 3X/week  PT Treatment/Interventions DME instruction;Gait training;Stair training;Functional mobility training;Therapeutic  activities;Therapeutic exercise;Patient/family education  PT Recommendation  Follow Up Recommendations Home health PT;Supervision/Assistance - 24 hour  Equipment Recommended Tub/shower seat  Individuals Consulted  Consulted and  Agree with Results and Recommendations Patient  Acute Rehab PT Goals  PT Goal Formulation With patient  Time For Goal Achievement 09/16/11  Potential to Achieve Goals Good  Pt will go Supine/Side to Sit with modified independence  PT Goal: Supine/Side to Sit - Progress Goal set today  Pt will go Sit to Supine/Side with modified independence  PT Goal: Sit to Supine/Side - Progress Goal set today  Pt will go Sit to Stand with modified independence  PT Goal: Sit to Stand - Progress Goal set today  Pt will go Stand to Sit with modified independence  PT Goal: Stand to Sit - Progress Goal set today  Pt will Ambulate >150 feet;with supervision;with least restrictive assistive device  PT Goal: Ambulate - Progress Goal set today  PT General Charges  $$ ACUTE PT VISIT 1 Procedure  PT Evaluation  $Initial PT Evaluation Tier II 1 Procedure   [place], [place] [phone]

## 2011-09-17 NOTE — Progress Notes (Signed)
Physical Therapy Treatment Patient Details Name: Vanessa Sherman MRN: [id number] DOB: 11/27/1924 Today's Date: 09/17/2011 Time: 8469-6295 PT Time Calculation (min): 18 min  PT Assessment / Plan / Recommendation Comments on Treatment Session  Pt making good progress with mobility.  Pt's daughter can take her to OPPT.    Follow Up Recommendations  Outpatient PT    Barriers to Discharge        Equipment Recommendations  Tub/shower seat    Recommendations for Other Services    Frequency Min 3X/week   Plan Discharge plan needs to be updated;Frequency remains appropriate    Precautions / Restrictions Precautions Precautions: Fall Restrictions Weight Bearing Restrictions: No   Pertinent Vitals/Pain N/A    Mobility  Transfers Sit to Stand: From toilet;From chair/3-in-1;5: Supervision;4: Min assist Stand to Sit: To toilet;To chair/3-in-1;With upper extremity assist;With armrests;5: Supervision;4: Min assist Details for Transfer Assistance: cues for hand placement.  Min A to/from toilet.  Supervision to/from chair. Ambulation/Gait Ambulation/Gait Assistance: 4: Min guard Ambulation Distance (Feet): 125 Feet Assistive device: Rolling walker Gait Pattern: Decreased stride length;Step-through pattern Gait velocity: slow cadence.    Exercises     PT Diagnosis:    PT Problem List:   PT Treatment Interventions:     PT Goals Acute Rehab PT Goals PT Goal: Sit to Stand - Progress: Progressing toward goal PT Goal: Stand to Sit - Progress: Progressing toward goal PT Goal: Ambulate - Progress: Progressing toward goal  Visit Information  Last PT Received On: 09/17/11    Subjective Data  Subjective: "They might see my ass," pt stated about walking.   Cognition  Overall Cognitive Status: Appears within functional limits for tasks assessed/performed Arousal/Alertness: Awake/alert Orientation Level: Appears intact for tasks assessed Behavior During Session: [facility] for tasks performed    Balance  Static Standing Balance Static Standing - Balance Support: No upper extremity supported;During functional activity Static Standing - Level of Assistance: 5: Stand by assistance  End of Session PT - End of Session Equipment Utilized During Treatment: Gait belt Activity Tolerance: Patient tolerated treatment well Patient left: in chair;with call bell/phone within reach Nurse Communication: Mobility status   GP     Vanessa Sherman 09/17/2011, 10:20 AM  [employer] PT [phone]

## 2011-09-17 NOTE — Progress Notes (Signed)
Filed Vitals:   09/16/11 0518 09/16/11 1500 09/16/11 2215 09/17/11 0619  BP: 129/68 121/70 119/60 167/88  Pulse: 66 68 64 64  Temp: 98.1 F (36.7 C) 98.1 F (36.7 C) 98.4 F (36.9 C) 98.3 F (36.8 C)  TempSrc: Oral Oral Oral Oral  Resp: 17 18 20 20   Height:      Weight: 65.3 kg (143 lb 15.4 oz)   63.2 kg (139 lb 5.3 oz)  SpO2: 99% 99% 98% 99%    Intake/Output Summary (Last 24 hours) at 09/17/11 0740 Last data filed at 09/17/11 0330  Gross per 24 hour  Intake    350 ml  Output    450 ml  Net   -100 ml    SUBJECTIVE Patient complains of diarrhea all night. No chest pain or SOB. No cough. Is very hungry.  LABS: Basic Metabolic Panel:  Basename 09/17/11 0605 09/16/11 0530  NA 135 133*  K 3.7 4.1  CL 97 96  CO2 26 30  GLUCOSE 91 90  BUN 7 17  CREATININE 0.91 1.06  CALCIUM 9.1 8.9  MG -- --  PHOS -- --   Liver Function Tests:  [facility] 09/17/11 0605 09/16/11 0530  AST 82* 120*  ALT 82* 95*  ALKPHOS 160* 140*  BILITOT 0.3 0.3  PROT 6.5 6.1  ALBUMIN 2.9* 2.9*    Basename 09/15/11 1554  LIPASE 47  AMYLASE --   CBC:  Basename 09/17/11 0605 09/16/11 0530 09/15/11 1554  WBC 7.3 12.4* --  NEUTROABS -- -- 12.6*  HGB 10.9* 10.7* --  HCT 31.9* 31.2* --  MCV 87.6 88.1 --  PLT 186 186 --   Thyroid Function Tests:  Basename 09/16/11 0530  TSH 1.776  T4TOTAL --  T3FREE --  THYROIDAB --    Radiology/Studies:  Dg Pelvis 1-2 Views  09/15/2011  *RADIOLOGY REPORT*  Clinical Data: Right hip pain  PELVIS - 1-2 VIEW  Comparison: Lumbar spine 06/12/2010  Findings: Bilateral total hip arthroplasty using noncemented femoral components with two fixation screws in the right acetabular component and a single fixation screw in the left acetabular component.  Components appear well seated.  No evidence of acute fracture or subluxation.  Visualization of the sacrum and SI joints is limited due to overlying bowel gas and contrast.  IMPRESSION: Bilateral total hip  arthroplasties appear well seated.  No acute fractures demonstrated.  Original Report Authenticated By: Marlon Pel, M.D.   Ct Abdomen Pelvis W Contrast  09/16/2011  *RADIOLOGY REPORT*  Clinical Data: 78 year old female with abdominal pain nausea and vomiting.  CT ABDOMEN AND PELVIS WITH CONTRAST  Technique:  Multidetector CT imaging of the abdomen and pelvis was performed following the standard protocol during bolus administration of intravenous contrast.  Contrast: OMNIPAQUE IOHEXOL 300 MG/ML  SOLN  Comparison: Thoracic spine CT 06/12/2010.  Findings: No pericardial or pleural effusion.  Minor dependent opacity at the lung bases is atelectasis.  Osteopenia. Degenerative changes in the spine.  Bilateral hip arthroplasties. No acute osseous abnormality identified.  Streak artifact limits visualization of much of the pelvis due to the bilateral hip implants.  No definite pelvic free fluid or abnormality of the distal colon.  Visualized uterus within normal limits.  The bladder is only faintly visible.  Oral contrast has reached the splenic flexure.  Normal proximal colon.  Appendix not identified.  No pericecal inflammation is evident.  Visible distal small bowel loops are within normal limits and contain contrast.  No dilated proximal small  bowel. Decompressed stomach.  Duodenum within normal limits.  Gallbladder surgically absent.  Pneumobilia, otherwise no significant intrahepatic biliary ductal dilatation.  The CBD also appears within normal limits. Liver enhancement within normal limits.  Negative spleen, pancreas, adrenal glands, and portal venous system.  Ectasia and atherosclerosis of the aorta.  Ectasia and atherosclerosis of the iliac arteries.  Chronic left renal cyst re-identified and stable.  No perinephric stranding, hydronephrosis or hydroureter.  No abdominal free fluid. No lymphadenopathy.  IMPRESSION: 1.  No acute findings identified in the abdomen.   Pneumobilia suggests previous  ERCP with sphincterotomy.  Gallbladder surgically absent. 2.  Limited visualization of the pelvis due to bilateral hip implants, with no acute findings identified.  Original Report Authenticated By: Harley Hallmark, M.D.   Dg Chest Portable 1 View  09/15/2011  *RADIOLOGY REPORT*  Clinical Data: Fever.  PORTABLE CHEST - 1 VIEW  Comparison: 12/25/2009  Findings: Peribronchial thickening and interstitial prominence, likely chronic interstitial lung disease.  Heart is normal size. Bibasilar scarring.  No acute infiltrates or effusions.  No acute bony abnormality.  IMPRESSION: Progressive chronic interstitial lung disease.  No acute findings.  Original Report Authenticated By: Cyndie Chime, M.D.    PHYSICAL EXAM General: Elderly,thin, in no acute distress. Head: Normocephalic, atraumatic, sclera non-icteric, no xanthomas, nares are without discharge. Neck: Negative for carotid bruits. JVD not elevated. Lungs: Clear bilaterally to auscultation without wheezes, rales, or rhonchi. Breathing is unlabored. Heart: RRR S1 S2 without murmurs, rubs, or gallops.  Abdomen: Soft, non-tender, non-distended with normoactive bowel sounds. No hepatomegaly. No rebound/guarding. No obvious abdominal masses. Msk:  Strength and tone appears normal for age. Extremities: No clubbing, cyanosis or edema.  Distal pedal pulses are 2+ and equal bilaterally. Neuro: Alert and oriented X 3. Moves all extremities spontaneously. Psych:  Responds to questions appropriately with a normal affect.  ASSESSMENT AND PLAN: 1. Paroxysmal atrial fibrillation. In NSR now. Agree with Dr. Jens Som that amiodarone should be held given elevation of LFTs and interstitial lung disease noted on CXR, Will observe for recurrent Afib. Not a candidate for coumadin. Potential use of alternative antiarrhythmic therapy will need to wait until amiodarone has washed out of system.  2. UTI  3. Diarrhea patient attributes this to GI contrast for CT.   4.  HTN good on Maxzide  Principal Problem:  *Nausea & vomiting Active Problems:  PAF (paroxysmal atrial fibrillation)  Hypertension  Abdominal pain  UTI (lower urinary tract infection)  Elevated liver enzymes    Signed, Lareina Espino [place] MD,FACC 09/17/2011 7:47 AM

## 2011-10-02 ENCOUNTER — Other Ambulatory Visit (INDEPENDENT_AMBULATORY_CARE_PROVIDER_SITE_OTHER): Payer: Medicare Other

## 2011-10-02 ENCOUNTER — Encounter: Payer: Self-pay | Admitting: Nurse Practitioner

## 2011-10-02 ENCOUNTER — Ambulatory Visit (INDEPENDENT_AMBULATORY_CARE_PROVIDER_SITE_OTHER): Payer: Medicare Other | Admitting: Nurse Practitioner

## 2011-10-02 VITALS — BP 154/70 | HR 80 | Ht 62.0 in | Wt 128.6 lb

## 2011-10-02 DIAGNOSIS — I10 Essential (primary) hypertension: Secondary | ICD-10-CM

## 2011-10-02 DIAGNOSIS — I480 Paroxysmal atrial fibrillation: Secondary | ICD-10-CM

## 2011-10-02 DIAGNOSIS — R0989 Other specified symptoms and signs involving the circulatory and respiratory systems: Secondary | ICD-10-CM

## 2011-10-02 DIAGNOSIS — D649 Anemia, unspecified: Secondary | ICD-10-CM

## 2011-10-02 DIAGNOSIS — I4891 Unspecified atrial fibrillation: Secondary | ICD-10-CM

## 2011-10-02 LAB — BASIC METABOLIC PANEL
BUN: 15 mg/dL (ref 6–23)
CO2: 29 mEq/L (ref 19–32)
Calcium: 9.2 mg/dL (ref 8.4–10.5)
Chloride: 88 mEq/L — ABNORMAL LOW (ref 96–112)
Creatinine, Ser: 1 mg/dL (ref 0.4–1.2)
GFR: 54.11 mL/min — ABNORMAL LOW (ref >60.00)
Glucose, Bld: 102 mg/dL — ABNORMAL HIGH (ref 70–99)
Potassium: 3.8 mEq/L (ref 3.5–5.1)
Sodium: 128 mEq/L — ABNORMAL LOW (ref 135–145)

## 2011-10-02 LAB — CBC WITH DIFFERENTIAL/PLATELET
Basophils Absolute: 0 10*3/uL (ref 0.0–0.1)
Basophils Relative: 0.8 % (ref 0.0–3.0)
Eosinophils Absolute: 0 10*3/uL (ref 0.0–0.7)
Eosinophils Relative: 0.8 % (ref 0.0–5.0)
HCT: 36 % (ref 36.0–46.0)
Hemoglobin: 11.9 g/dL — ABNORMAL LOW (ref 12.0–15.0)
Lymphocytes Relative: 14.4 % (ref 12.0–46.0)
Lymphs Abs: 0.8 10*3/uL (ref 0.7–4.0)
MCHC: 32.9 g/dL (ref 30.0–36.0)
MCV: 92 fl (ref 78.0–100.0)
Monocytes Absolute: 0.6 10*3/uL (ref 0.1–1.0)
Monocytes Relative: 10.3 % (ref 3.0–12.0)
Neutro Abs: 4.1 10*3/uL (ref 1.4–7.7)
Neutrophils Relative %: 73.7 % (ref 43.0–77.0)
Platelets: 303 10*3/uL (ref 150.0–400.0)
RBC: 3.91 Mil/uL (ref 3.87–5.11)
RDW: 13.9 % (ref 11.5–14.6)
WBC: 5.5 10*3/uL (ref 4.5–10.5)

## 2011-10-02 NOTE — Assessment & Plan Note (Signed)
Blood pressure is better at home. Daughter is restricting her sodium. They will continue to monitor at home. For now, no change in her current regimen. We will see her back in about 4 months.

## 2011-10-02 NOTE — Assessment & Plan Note (Signed)
She is now off of her amiodarone due to elevated LFT's and interstitial lung disesae. Not a candidate for anticoagulation. She is in sinus on physical exam today. Will have to manage with rate control only if we have recurrent atrial fib.

## 2011-10-02 NOTE — Patient Instructions (Addendum)
Continue to monitor your blood pressure at home. Let us know if your blood pressure stays consistently above 160  Stay on your current medicines for now  We are checking labs today and I will send to Dr. Cyndia Bent  We will see you back in about 4 months.  Call the [facility] office at [phone] if you have any questions, problems or concerns.

## 2011-10-02 NOTE — Progress Notes (Signed)
Vanessa Sherman Date of Birth: 02/17/1925 Medical Record #[id number]  History of Present Illness: Vanessa Sherman is seen today for a follow up visit. It is a one month check. She is seen for Dr. [place]. She has a history of PAF and has previously been on amiodarone. She also has HTN and was recently placed on Maxzide.  She comes in today. She is here with her daughter. Since her last visit here in early June, she was hospitalized with a UTI. Also noted to have elevated LFT's and interstitial lung disease. She has a thyroid abnormality. Cardiology was consulted and her amiodarone was stopped. She has been felt to be a poor candidate for anticoagulation given her high risk for falls. Her daughter is not letting her have salt. Blood pressure has been in the 130 to 140's at home. No chest pain. Her activity remains quite limited. She reports she is very scared of falling. She has had her LFT's rechecked earlier this week with her PCP, Dr. Cyndia Bent. She is seeing Dr. Warden Fillers later this summer for her thyroid issues.  Current Outpatient Prescriptions on File Prior to Visit  Medication Sig Dispense Refill  . aspirin EC 81 MG tablet Take 1 tablet (81 mg total) by mouth daily.      Marland Kitchen levothyroxine (SYNTHROID, LEVOTHROID) 50 MCG tablet Take 50 mcg by mouth daily.        Marland Kitchen triamterene-hydrochlorothiazide (MAXZIDE-25) 37.5-25 MG per tablet Take 1 each (1 tablet total) by mouth daily.  30 tablet  11    No Known Allergies  Past Medical History  Diagnosis Date  . Diabetes mellitus     TYPE 2  . Hypertension   . Hearing loss   . Glaucoma   . PAF (paroxysmal atrial fibrillation)     EF IS 55-60%  . Hypothyroid   . Gait instability   . Fibula fracture   . Osteoarthritis   . Anemia   . Memory impairment     Mild  . Gastroenteritis   . COPD (chronic obstructive pulmonary disease)   . Amiodarone toxicity June 2013    stopped in June 2013 due to elevated LFT's, interstitial lung disease, thyroid  abnormaility    Past Surgical History  Procedure Date  . Cholecystectomy   . Appendectomy   . Tonsillectomy   . Total hip arthroplasty     bilateral    History  Smoking status  . Former Smoker  . Quit date: 07/24/1995  Smokeless tobacco  . Not on file    History  Alcohol Use  . Yes    Rarely    History reviewed. No pertinent family history.  Review of Systems: The review of systems is positive for back and hip pain.  All other systems were reviewed and are negative.  Physical Exam: BP 154/70  Pulse 80  Ht 5\' 2"  (1.575 m)  Wt 128 lb 9.6 oz (58.333 kg)  BMI 23.52 kg/m2 BP by me is 150/70 Patient is very pleasant and in no acute distress. She is in a wheelchair. She is hard of hearing. Skin is warm and dry. Color is normal.  HEENT is unremarkable. Normocephalic/atraumatic. PERRL. Sclera are nonicteric. Neck is supple. No masses. No JVD. Lungs are clear. Cardiac exam shows a regular rate and rhythm. Abdomen is soft. Extremities are with just trace edema. Gait and ROM are intact. No gross neurologic deficits noted.  LABORATORY DATA: BMET is pending.   Lab Results  Component Value Date   WBC  7.3 09/17/2011   HGB 10.9* 09/17/2011   HCT 31.9* 09/17/2011   PLT 186 09/17/2011   GLUCOSE 91 09/17/2011   ALT 94* 09/17/2011   AST 90* 09/17/2011   NA 135 09/17/2011   K 3.7 09/17/2011   CL 97 09/17/2011   CREATININE 0.91 09/17/2011   BUN 7 09/17/2011   CO2 26 09/17/2011   TSH 3.511 09/17/2011   INR 0.97 09/15/2011   HGBA1C  Value: 5.9 (NOTE)                                                                       According to the ADA Clinical Practice Recommendations for 2011, when HbA1c is used as a screening test:   >=6.5%   Diagnostic of Diabetes Mellitus           (if abnormal result  is confirmed)  5.7-6.4%   Increased risk of developing Diabetes Mellitus  References:Diagnosis and Classification of Diabetes Mellitus,Diabetes Care,2011,34(Suppl 1):S62-S69 and Standards of Medical Care in          Diabetes - 2011,Diabetes Care,2011,34  (Suppl 1):S11-S61.* 12/24/2009     Assessment / Plan:

## 2011-10-15 NOTE — Discharge Summary (Signed)
-  agree with plan as above. I have review the data. -nausea and vomiting resolved with constipation treatment. ? Viral gastroenteritis. -follow up with PCP.

## 2012-10-13 ENCOUNTER — Encounter: Payer: Self-pay | Admitting: Family Medicine

## 2012-10-13 ENCOUNTER — Ambulatory Visit (INDEPENDENT_AMBULATORY_CARE_PROVIDER_SITE_OTHER): Payer: Medicare Other | Admitting: Family Medicine

## 2012-10-13 VITALS — BP 139/85 | HR 133 | Temp 98.6°F | Resp 18 | Ht 62.0 in | Wt 132.0 lb

## 2012-10-13 DIAGNOSIS — E039 Hypothyroidism, unspecified: Secondary | ICD-10-CM

## 2012-10-13 DIAGNOSIS — Z87440 Personal history of urinary (tract) infections: Secondary | ICD-10-CM

## 2012-10-13 DIAGNOSIS — R5381 Other malaise: Secondary | ICD-10-CM

## 2012-10-13 DIAGNOSIS — I480 Paroxysmal atrial fibrillation: Secondary | ICD-10-CM

## 2012-10-13 DIAGNOSIS — R351 Nocturia: Secondary | ICD-10-CM

## 2012-10-13 DIAGNOSIS — R531 Weakness: Secondary | ICD-10-CM

## 2012-10-13 DIAGNOSIS — I4891 Unspecified atrial fibrillation: Secondary | ICD-10-CM

## 2012-10-13 LAB — COMPREHENSIVE METABOLIC PANEL
Albumin: 4 g/dL (ref 3.5–5.2)
BUN: 17 mg/dL (ref 6–23)
CO2: 25 mEq/L (ref 19–32)
Calcium: 9.6 mg/dL (ref 8.4–10.5)
Chloride: 97 mEq/L (ref 96–112)
GFR: 46.6 mL/min — ABNORMAL LOW (ref >60.00)
Glucose, Bld: 113 mg/dL — ABNORMAL HIGH (ref 70–99)
Potassium: 4.4 mEq/L (ref 3.5–5.1)
Sodium: 134 mEq/L — ABNORMAL LOW (ref 135–145)
Total Protein: 7.4 g/dL (ref 6.0–8.3)

## 2012-10-13 LAB — CBC WITH DIFFERENTIAL/PLATELET
Basophils Relative: 0.1 % (ref 0.0–3.0)
Eosinophils Relative: 0.3 % (ref 0.0–5.0)
MCV: 91.2 fl (ref 78.0–100.0)
Monocytes Absolute: 0.6 10*3/uL (ref 0.1–1.0)
Monocytes Relative: 4.4 % (ref 3.0–12.0)
Neutrophils Relative %: 88.6 % — ABNORMAL HIGH (ref 43.0–77.0)
Platelets: 278 10*3/uL (ref 150.0–400.0)
RBC: 4 Mil/uL (ref 3.87–5.11)
WBC: 13.9 10*3/uL — ABNORMAL HIGH (ref 4.5–10.5)

## 2012-10-13 LAB — POCT URINALYSIS DIPSTICK
Bilirubin, UA: NEGATIVE
Glucose, UA: NEGATIVE
Ketones, UA: NEGATIVE
Nitrite, UA: NEGATIVE
pH, UA: 5.5

## 2012-10-13 LAB — TSH: TSH: 0.5 u[IU]/mL (ref 0.35–5.50)

## 2012-10-13 MED ORDER — VERAPAMIL HCL 40 MG PO TABS: ORAL_TABLET | ORAL | Status: DC

## 2012-10-13 NOTE — Assessment & Plan Note (Signed)
Per pt's daughter this was a condition that came on secondary to pt's use of amiodarone in the past. Check TSH today.

## 2012-10-13 NOTE — Assessment & Plan Note (Signed)
Generalized, but mostly bilat lower extremity: deconditioning. Denies pain or focal weakness. Encouraged pt and daughter to continue to ambulate around house but be careful--fall avoidance discussed.

## 2012-10-13 NOTE — Progress Notes (Signed)
Office Note 10/13/2012  CC:  Chief Complaint  Patient presents with  . second opinion    on general health    HPI:  Vanessa Sherman is a 79 y.o. White female who is here with her daughter Vanessa Sherman to establish care/get "2nd opinion on general health". Patient's most recent primary MD: Dr. Larita Fife at [facility].  Switching b/c "nothing was ever explained regarding her health problems". Old records were not reviewed prior to or during today's visit.  Speaks english and [place].  1) concerned b/c she had nocturia x 2 last night.  No dysuria.  No urinary frequency during daytime.  No urgency and no abd pain or nausea.  Has had a few UTI's in the past and daughter admits her mom is hypervigilant due to this.  2) Hx of PAF, says she does occ feel palpitations-"in the front of neck", but not in chest.  No CP, no SOB.  3) Apparently had a rash on several areas of body recently, itchy, daughter says she was given a dermatologist's name by our administrative staff when she called to make this appt--Dr. Morton Stall she went there and says she was rx'd prednisone tabs and steroid cream and this is getting better.  4) Generalized LE weakness, hx of falls in the past and this is why she is not on anticoagulant for her PAF. No LE pain.  Has been through PT and when asked how this was she waves her hand as if to say this is not helpful BUT her daughter says she would never do the home exercises that PT advised her to do.    Past Medical History  Diagnosis Date  . Diabetes mellitus     TYPE 2  . Hypertension   . Hearing loss   . Glaucoma   . PAF (paroxysmal atrial fibrillation)     EF IS 55-60%  . Hypothyroid   . Gait instability   . Fibula fracture   . Osteoarthritis   . Anemia   . Memory impairment     Mild  . COPD (chronic obstructive pulmonary disease)   . Amiodarone toxicity June 2013    stopped in June 2013 due to elevated LFT's, interstitial lung disease, thyroid  abnormaility  . History of UTI     Hard to tell how frequent these have been    Past Surgical History  Procedure Laterality Date  . Cholecystectomy    . Appendectomy    . Tonsillectomy    . Total hip arthroplasty      bilateral    History reviewed. No pertinent family history.  History   Social History  . Marital Status: Single    Spouse Name: N/A    Number of Children: 1  . Years of Education: N/A   Occupational History  .     Social History Main Topics  . Smoking status: Former Smoker    Quit date: 07/24/1995  . Smokeless tobacco: Not on file  . Alcohol Use: Yes     Comment: Rarely  . Drug Use: No  . Sexually Active: Not on file   Other Topics Concern  . Not on file   Social History Narrative   Widowed since around 2000 (3rd husband).  Divorced from 1st husband.   Originally from [place] lived in [place] x 4 decades.   Occupation: nurse   Cigarettes: 25 pack-yr hx; quit around 2000.  No alcohol or drugs.  Outpatient Encounter Prescriptions as of 10/13/2012  Medication Sig Dispense Refill  . levothyroxine (SYNTHROID, LEVOTHROID) 50 MCG tablet Take 50 mcg by mouth daily.        Marland Kitchen lisinopril (PRINIVIL,ZESTRIL) 5 MG tablet Take 5 mg by mouth daily.      Marland Kitchen triamterene-hydrochlorothiazide (MAXZIDE-25) 37.5-25 MG per tablet Take 1 each (1 tablet total) by mouth daily.  30 tablet  11  . verapamil (CALAN) 40 MG tablet 1/2-1 tab po tid  45 tablet  1   No facility-administered encounter medications on file as of 10/13/2012.    No Known Allergies  ROS Review of Systems  Constitutional: Negative for fever and diaphoresis.  HENT: Positive for rhinorrhea and sneezing. Negative for congestion and sore throat.   Eyes: Negative for discharge, redness and visual disturbance.  Respiratory: Negative for cough, shortness of breath and wheezing.   Cardiovascular: Positive for palpitations. Negative for chest pain.  Gastrointestinal: Negative for nausea and  abdominal pain.  Endocrine: Negative for polydipsia, polyphagia and polyuria.  Genitourinary: Negative for dysuria, urgency, frequency, hematuria, flank pain and difficulty urinating.  Musculoskeletal: Negative for back pain and joint swelling.  Skin: Negative for rash.  Neurological: Negative for dizziness, tremors, syncope and headaches.  Hematological: Negative for adenopathy. Does not bruise/bleed easily.  Psychiatric/Behavioral: Negative for behavioral problems and dysphoric mood. The patient is nervous/anxious.     PE; Blood pressure 139/85, pulse 133, temperature 98.6 F (37 C), temperature source Temporal, resp. rate 18, height 5\' 2"  (1.575 m), weight 132 lb (59.875 kg), SpO2 97.00%. Gen: alert, oriented x 4.  Sitting up in wheelchair. AFFECT: pleasant, lucid thought and speech. HEENT: eyes without injection, drainage, or swelling.  Ears: EACs clear, TMs with normal light reflex and landmarks.  Nose: Clear rhinorrhea, with some dried, crusty exudate adherent to mildly injected mucosa.  No purulent d/c.  No paranasal sinus TTP.  No facial swelling.  Throat and mouth without focal lesion.  No pharyngial swelling, erythema, or exudate.   Neck: supple, no LAD.   LUNGS: CTA bilat, nonlabored resps.   CV: Regular with occ ectopic beats, rate 130s, no m/r/g. EXT: no c/c/e SKIN: no rash except mild pinkish macular rash on nose. Neuro: moves all extremities equally.  Walks with assistance but "wobbly" gait due to legs feeling unstable.   Pertinent labs:  CC UA today: small LEU.  Otherwise normal.  ASSESSMENT AND PLAN:   New pt: obtain old records.  Hypothyroid Per pt's daughter this was a condition that came on secondary to pt's use of amiodarone in the past. Check TSH today.  PAF (paroxysmal atrial fibrillation) Needs rate control. Added verapamil 40mg  tab, 1/2 tab tid. Call if bp is getting lower than 120/70. Not a candidate for anticoagulation due to high fall risk.   Apparently had multiple adverse effects from amiodarone.  Weakness Generalized, but mostly bilat lower extremity: deconditioning. Denies pain or focal weakness. Encouraged pt and daughter to continue to ambulate around house but be careful--fall avoidance discussed.  Diabetes mellitus This was not mentioned by daughter, pt, or myself today. Will check old records to clarify.  Nocturia Patient is fearful of UTI, so she is hypervigilant. Will send urine for c/s (UA showed small LEU today), no abx started at this time.   An After Visit Summary was printed and given to the patient.  Return for 7-10 days (30 min f/u appt--a-fib).

## 2012-10-13 NOTE — Assessment & Plan Note (Addendum)
Needs rate control. Added verapamil 40mg  tab, 1/2 tab tid. Call if bp is getting lower than 120/70. Not a candidate for anticoagulation due to high fall risk.  Apparently had multiple adverse effects from amiodarone.

## 2012-10-13 NOTE — Patient Instructions (Signed)
Call if your blood pressure is < 120/70 for 3 checks in a row.

## 2012-10-13 NOTE — Assessment & Plan Note (Signed)
This was not mentioned by daughter, pt, or myself today. Will check old records to clarify.

## 2012-10-13 NOTE — Assessment & Plan Note (Signed)
Patient is fearful of UTI, so she is hypervigilant. Will send urine for c/s (UA showed small LEU today), no abx started at this time.

## 2012-10-14 ENCOUNTER — Ambulatory Visit: Payer: Medicare Other

## 2012-10-14 DIAGNOSIS — R7309 Other abnormal glucose: Secondary | ICD-10-CM

## 2012-10-16 LAB — URINE CULTURE

## 2012-10-18 MED ORDER — CIPROFLOXACIN HCL 250 MG PO TABS: 250.0000 mg | ORAL_TABLET | Freq: Two times a day (BID) | ORAL | Status: DC

## 2012-10-18 NOTE — Addendum Note (Signed)
Addended by: Eulah Pont on: 10/18/2012 05:00 PM   Modules accepted: Orders

## 2012-10-21 ENCOUNTER — Ambulatory Visit (INDEPENDENT_AMBULATORY_CARE_PROVIDER_SITE_OTHER): Payer: Medicare Other | Admitting: Family Medicine

## 2012-10-21 ENCOUNTER — Encounter: Payer: Self-pay | Admitting: Family Medicine

## 2012-10-21 VITALS — BP 136/74 | HR 117 | Temp 99.2°F | Resp 16 | Wt 136.0 lb

## 2012-10-21 DIAGNOSIS — H9193 Unspecified hearing loss, bilateral: Secondary | ICD-10-CM

## 2012-10-21 DIAGNOSIS — N390 Urinary tract infection, site not specified: Secondary | ICD-10-CM | POA: Insufficient documentation

## 2012-10-21 DIAGNOSIS — I10 Essential (primary) hypertension: Secondary | ICD-10-CM

## 2012-10-21 DIAGNOSIS — E039 Hypothyroidism, unspecified: Secondary | ICD-10-CM

## 2012-10-21 DIAGNOSIS — I4891 Unspecified atrial fibrillation: Secondary | ICD-10-CM

## 2012-10-21 DIAGNOSIS — H9190 Unspecified hearing loss, unspecified ear: Secondary | ICD-10-CM

## 2012-10-21 NOTE — Assessment & Plan Note (Signed)
Presumably transient, secondary to amiodarone toxicity. With persistently increased resting HR and recent TSH 0.5, I chose to d/c this med and we'll follow TSH to see if this condition is persisting.

## 2012-10-21 NOTE — Assessment & Plan Note (Addendum)
Stable, but would like HR to be around 80 with normal bp. PLAN: Start taking the 1/2 tab verapamil three times per day. Continue monitoring bp and HR. If, after another 1 week, her HR is still consistently >90 then increase her verapamil to a whole tab in the morning. ONLY INCREASE THE VERAPAMIL DOSE IF HER BLOOD PRESSURE IS > 110 OVER 60. Call if questions about whether or not to increase dose or if other questions.

## 2012-10-21 NOTE — Assessment & Plan Note (Signed)
BP ok 

## 2012-10-21 NOTE — Assessment & Plan Note (Signed)
She had minimal UTI sx's when I did her urine clx. I went ahead and treated with cipro and she'll finish this. Hopefully her recent mildly elevated WBC count is associated with the UTI and we'll see this come down. Recheck CBC at f/u in 1 mo.

## 2012-10-21 NOTE — Patient Instructions (Addendum)
Start taking the 1/2 tab verapamil three times per day. Continue monitoring bp and HR. If, after another 1 week, her HR is still consistently >90 then increase her verapamil to a whole tab in the morning. ONLY INCREASE THE VERAPAMIL DOSE IF HER BLOOD PRESSURE IS > 110 OVER 60. Call if questions about whether or not to increase dose or if other questions.

## 2012-10-21 NOTE — Progress Notes (Signed)
OFFICE NOTE  10/21/2012  CC:  Chief Complaint  Patient presents with  . Follow-up     HPI: Patient is a 79 y.o. Caucasian female who is here for 10d f/u UTI, HTN, hypothyroidism that was likely from amiodarone toxicity, and atrial fib--recently put on verapamil by me for rate control (not coumadin candidate due to high fall risk).  We stopped her lisinopril due to some recent low bp's after adding verapamil.  Also stopped her levothyroxine b/c her TSH was 0.5 and she is tachycardic.  Will follow TSH off this med to see if her hypothyroid state from having been on amiodarone is persistent or not.  Since stopping lisin and levothyr, bp has not been below 120/70 and HR ranges 90-98 per daughter. Pt is taking 1/2 of the verap tab TWICE a day instead of 3.  Pertinent PMH:  Past Medical History  Diagnosis Date  . Diabetes mellitus     TYPE 2  . Hypertension   . Hearing loss   . Glaucoma   . PAF (paroxysmal atrial fibrillation)     EF IS 55-60%  . Hypothyroid   . Gait instability   . Fibula fracture   . Osteoarthritis   . Anemia   . Memory impairment     Mild  . COPD (chronic obstructive pulmonary disease)   . Amiodarone toxicity June 2013    stopped in June 2013 due to elevated LFT's, interstitial lung disease, thyroid abnormaility  . History of UTI     Hard to tell how frequent these have been   Past Surgical History  Procedure Laterality Date  . Cholecystectomy    . Appendectomy    . Tonsillectomy    . Total hip arthroplasty      bilateral   History   Social History Narrative   Widowed since around 2000 (3rd husband).  Divorced from 1st husband.   Originally from [place] lived in [place] x 4 decades.   Occupation: nurse   Cigarettes: 25 pack-yr hx; quit around 2000.  No alcohol or drugs.             MEDS:  Outpatient Prescriptions Prior to Visit  Medication Sig Dispense Refill  . ciprofloxacin (CIPRO) 250 MG tablet Take 1 tablet (250 mg total) by mouth 2  (two) times daily.  14 tablet  0  . levothyroxine (SYNTHROID, LEVOTHROID) 50 MCG tablet Take 50 mcg by mouth daily.        Marland Kitchen lisinopril (PRINIVIL,ZESTRIL) 5 MG tablet Take 5 mg by mouth daily.      Marland Kitchen triamterene-hydrochlorothiazide (MAXZIDE-25) 37.5-25 MG per tablet Take 1 each (1 tablet total) by mouth daily.  30 tablet  11  . verapamil (CALAN) 40 MG tablet 1/2-1 tab po tid  45 tablet  1   No facility-administered medications prior to visit.    PE: Blood pressure 136/74, pulse 117, temperature 99.2 F (37.3 C), temperature source Temporal, resp. rate 16, weight 136 lb (61.689 kg), SpO2 99.00%. Gen: Alert, well appearing.  Patient is oriented to person, place, time, and situation. AFFECT: pleasant.  Displays lucid thought and speech. CV: irreg irreg rhythm, rate 115-120.  No murmur audible. LUNGS: CTA bilat, nonlabored. EXT: no clubbing, cyanosis, or edema.   LAB:  Lab Results  Component Value Date   TSH 0.50 10/13/2012   Lab Results  Component Value Date   WBC 13.9* 10/13/2012   HGB 12.0 10/13/2012   HCT 36.5 10/13/2012   MCV 91.2 10/13/2012   PLT  278.0 10/13/2012   Lab Results  Component Value Date   CREATININE 1.2 10/13/2012   BUN 17 10/13/2012   NA 134* 10/13/2012   K 4.4 10/13/2012   CL 97 10/13/2012   CO2 25 10/13/2012   Lab Results  Component Value Date   ALT 14 10/13/2012   AST 22 10/13/2012   ALKPHOS 91 10/13/2012   BILITOT 0.6 10/13/2012   No results found for this basename: CHOL   No results found for this basename: HDL   No results found for this basename: LDLCALC   No results found for this basename: TRIG   No results found for this basename: CHOLHDL   No results found for this basename: PSA      IMPRESSION AND PLAN:  Atrial fibrillation Stable, but would like HR to be around 80 with normal bp. PLAN: Start taking the 1/2 tab verapamil three times per day. Continue monitoring bp and HR. If, after another 1 week, her HR is still consistently >90 then  increase her verapamil to a whole tab in the morning. ONLY INCREASE THE VERAPAMIL DOSE IF HER BLOOD PRESSURE IS > 110 OVER 60. Call if questions about whether or not to increase dose or if other questions.     Hypertension BP ok.  Hypothyroid Presumably transient, secondary to amiodarone toxicity. With persistently increased resting HR and recent TSH 0.5, I chose to d/c this med and we'll follow TSH to see if this condition is persisting.  UTI (urinary tract infection) She had minimal UTI sx's when I did her urine clx. I went ahead and treated with cipro and she'll finish this. Hopefully her recent mildly elevated WBC count is associated with the UTI and we'll see this come down. Recheck CBC at f/u in 1 mo.   An After Visit Summary was printed and given to the patient.  FOLLOW UP: 54mo

## 2012-11-19 ENCOUNTER — Ambulatory Visit (INDEPENDENT_AMBULATORY_CARE_PROVIDER_SITE_OTHER): Payer: Medicare Other | Admitting: Family Medicine

## 2012-11-19 ENCOUNTER — Encounter: Payer: Self-pay | Admitting: Family Medicine

## 2012-11-19 VITALS — BP 156/85 | HR 130 | Temp 98.2°F | Resp 18 | Wt 138.0 lb

## 2012-11-19 DIAGNOSIS — D72829 Elevated white blood cell count, unspecified: Secondary | ICD-10-CM

## 2012-11-19 DIAGNOSIS — I4891 Unspecified atrial fibrillation: Secondary | ICD-10-CM

## 2012-11-19 DIAGNOSIS — E039 Hypothyroidism, unspecified: Secondary | ICD-10-CM

## 2012-11-19 LAB — CBC WITH DIFFERENTIAL/PLATELET
Basophils Relative: 0.7 % (ref 0.0–3.0)
Eosinophils Absolute: 0.2 10*3/uL (ref 0.0–0.7)
HCT: 34.9 % — ABNORMAL LOW (ref 36.0–46.0)
Hemoglobin: 11.5 g/dL — ABNORMAL LOW (ref 12.0–15.0)
Lymphocytes Relative: 8.7 % — ABNORMAL LOW (ref 12.0–46.0)
Lymphs Abs: 0.6 10*3/uL — ABNORMAL LOW (ref 0.7–4.0)
MCHC: 33 g/dL (ref 30.0–36.0)
MCV: 89 fl (ref 78.0–100.0)
Neutro Abs: 5.3 10*3/uL (ref 1.4–7.7)
RBC: 3.92 Mil/uL (ref 3.87–5.11)
RDW: 14.6 % (ref 11.5–14.6)

## 2012-11-19 LAB — TSH: TSH: 3.89 u[IU]/mL (ref 0.35–5.50)

## 2012-11-19 NOTE — Patient Instructions (Addendum)
Take the verapamil 1/2 tab three times per day. If BP remains above 110 over 60 and if HR remains above 80, then slowly increase the verapamil by 1/2 tab at a time. Max dose up to 1 full tab 3 times per day.  Take TWO baby aspirin tabs (81mg  each) per day.

## 2012-11-19 NOTE — Progress Notes (Signed)
OFFICE NOTE  11/19/2012  CC:  Chief Complaint  Patient presents with  . Follow-up     HPI: Patient is a 79 y.o. Caucasian female who is here for 1 mo f/u a-fib, UTI, recheck TSH. She is taking 1/2 of the verapamil 40mg  twice per day, sometimes a whole tab in morning and half in afternoon/evening. Daughter a bit confused about our previous titration discussion: reviewed bp's the last 10d: syst avg 120s, diastolic avg 80s, HR anywhere from 70s up to 98.  Recent eye exam report is good per pt. Has occ feeling of palpitations/increased HR.  No CP or SOB.  No focal weakness. She is bored at home, says she is "too old" but denies persistent feeling of depression. She is a mildly anxious person.  Pertinent PMH:  Past Medical History  Diagnosis Date  . Diabetes mellitus     TYPE 2  . Hypertension   . Hearing loss   . Glaucoma   . PAF (paroxysmal atrial fibrillation)     EF IS 55-60%  . Hypothyroid   . Gait instability   . Fibula fracture   . Osteoarthritis   . Anemia   . Memory impairment     Mild  . COPD (chronic obstructive pulmonary disease)   . Amiodarone toxicity June 2013    stopped in June 2013 due to elevated LFT's, interstitial lung disease, thyroid abnormaility  . History of UTI     Hard to tell how frequent these have been    MEDS:  Outpatient Prescriptions Prior to Visit  Medication Sig Dispense Refill  . ciprofloxacin (CIPRO) 250 MG tablet Take 1 tablet (250 mg total) by mouth 2 (two) times daily.  14 tablet  0  . triamcinolone cream (KENALOG) 0.1 % Apply topically daily.       No facility-administered medications prior to visit.    PE: Blood pressure 156/85, pulse 130, temperature 98.2 F (36.8 C), temperature source Temporal, resp. rate 18, weight 138 lb (62.596 kg), SpO2 98.00%. Alert, tired appearing but NAD. Pleasant and lucid thought/speech. CV: Irreg irreg rhythm, rate about 90s.  No murmur. No c/c/e  IMPRESSION AND PLAN:  1) A-fib,  persistent.  Not coumadin candidate due to high fall risk.  Will make sure she is taking ASA 81mg --two every morning with food.  2) Leukocytosis, mild.  In the setting of UTI.  Repeat CBC today---she took abx for UTI and is completely asymptomatic from a urinary standpoint.  3) Hx of hypothyroidism: question of whether this was due to amiodarone.  Now that she is off amiodarone and her TSH 1 mo ago was 0.5, I decided to D/C her synthroid and we'll see how her TSH does.  Check TSH today.  FOLLOW UP: 3 mo

## 2012-11-24 ENCOUNTER — Other Ambulatory Visit: Payer: Self-pay | Admitting: Family Medicine

## 2012-11-24 DIAGNOSIS — D62 Acute posthemorrhagic anemia: Secondary | ICD-10-CM

## 2012-11-25 IMAGING — CT CT ABD-PELV W/ CM
3 of 5 series · 14 of 32 positions shown, 18 images · IV contrast (omnipaque)
Comparison: Thoracic spine CT 06/12/2010.

CLINICAL DATA: 87-year-old female with abdominal pain nausea and
vomiting.

CT ABDOMEN AND PELVIS WITH CONTRAST
TECHNIQUE: Multidetector CT imaging of the abdomen and pelvis was
performed following the standard protocol during bolus
administration of intravenous contrast.
Contrast: 100mL OMNIPAQUE IOHEXOL 300 MG/ML  SOLN

[Series 2: routine abdomen · axial · 0.76mm/px · z∈[-370,-170]mm · 3 of 80 slices shown, 7 images]
[im 20/80  soft-tissue]
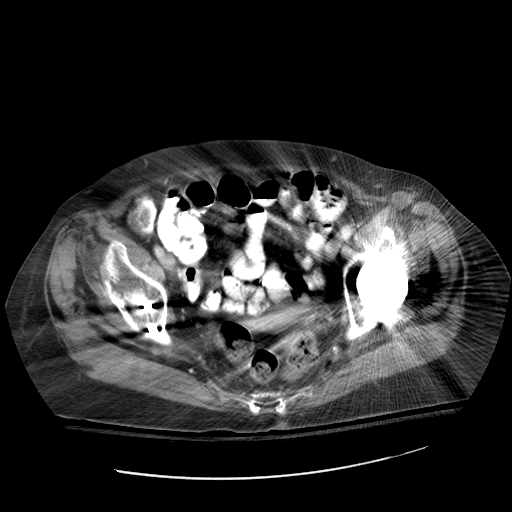
[im 20/80  lung]
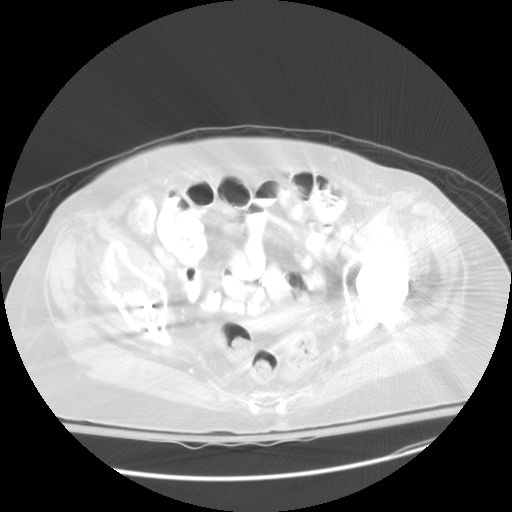
[im 20/80  bone]
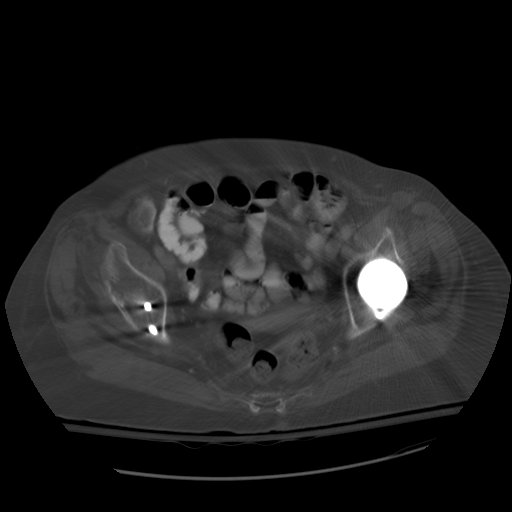
[im 40/80  soft-tissue]
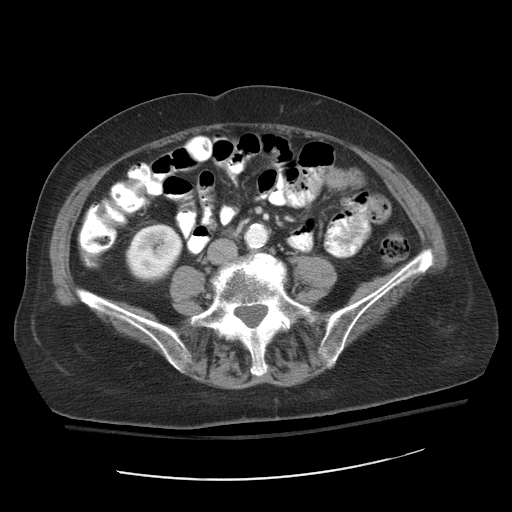
[im 40/80  lung]
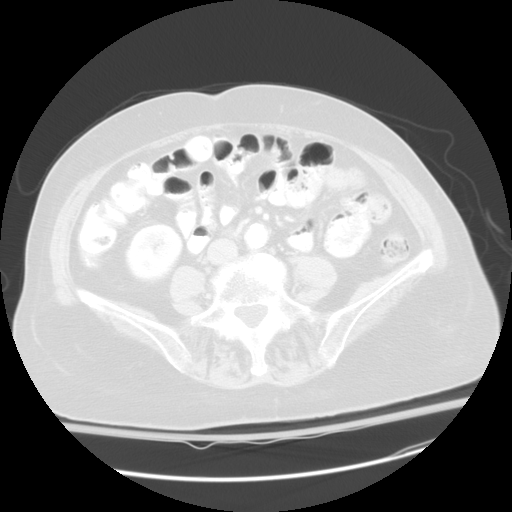
[im 60/80  soft-tissue]
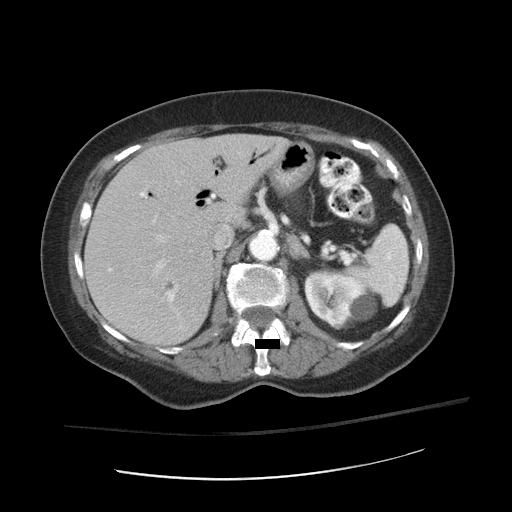
[im 60/80  lung]
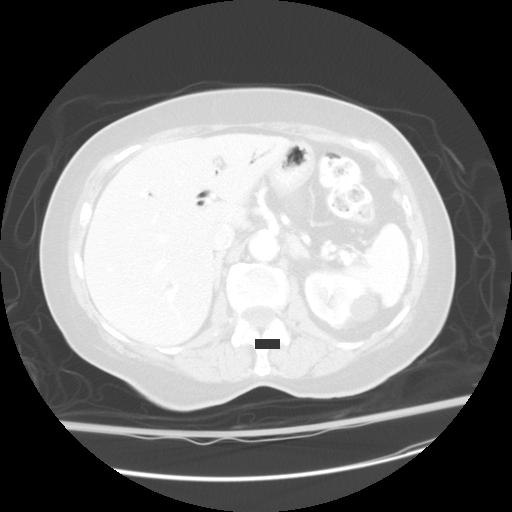

[Series 400: cor · coronal · 0.81mm/px · 3 of 147 slices shown]
[im 17/147  soft-tissue]
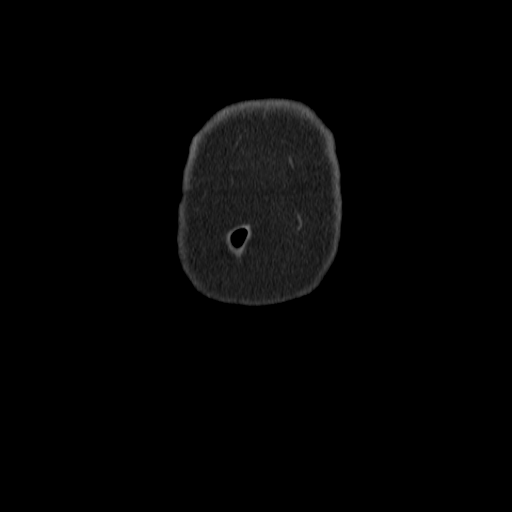
[im 33/147  soft-tissue]
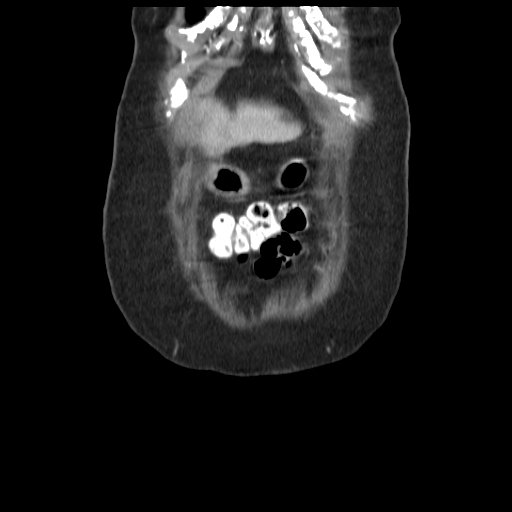
[im 49/147  soft-tissue]
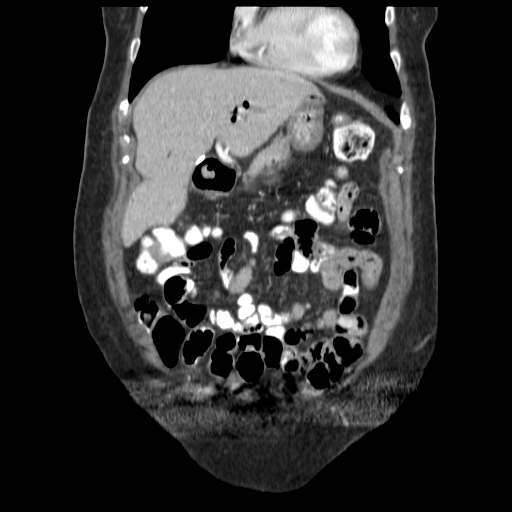

[Series 401: sag · sagittal · 0.81mm/px · 8 of 187 slices shown]
[im 16/187  soft-tissue]
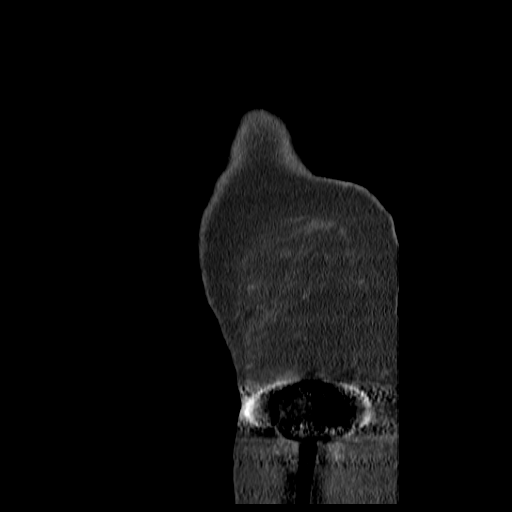
[im 47/187  soft-tissue]
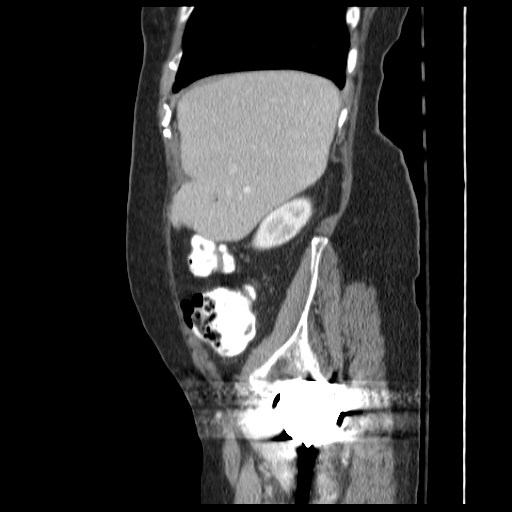
[im 63/187  soft-tissue]
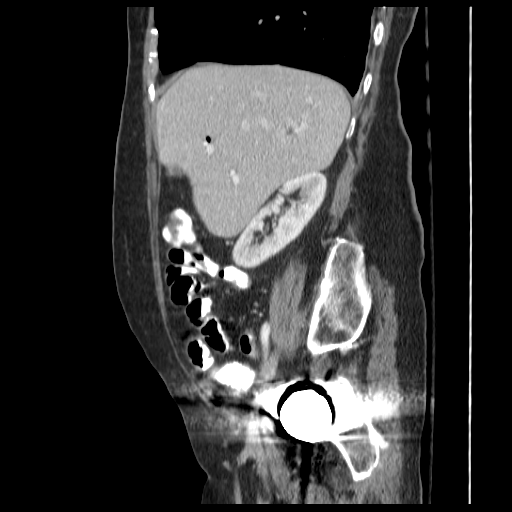
[im 78/187  soft-tissue]
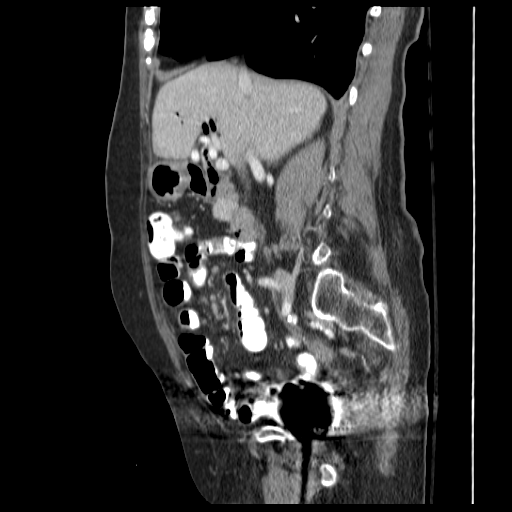
[im 109/187  soft-tissue]
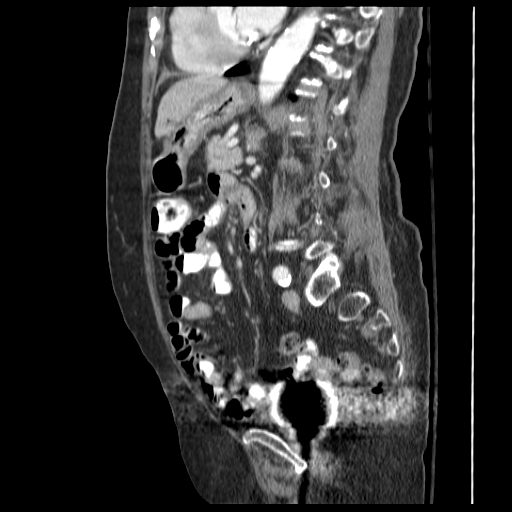
[im 125/187  soft-tissue]
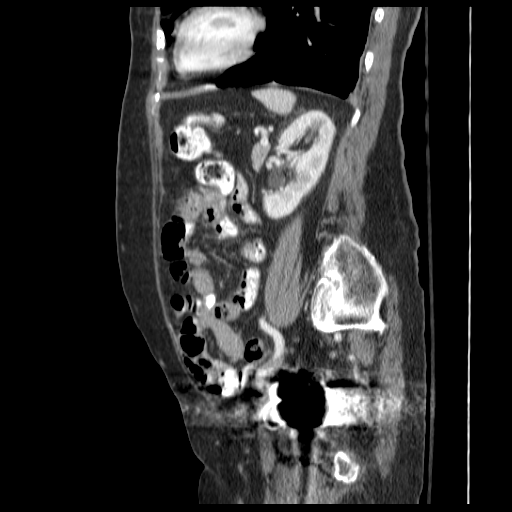
[im 140/187  soft-tissue]
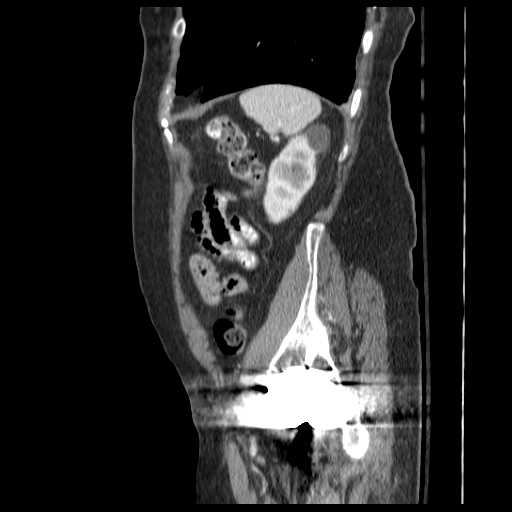
[im 171/187  soft-tissue]
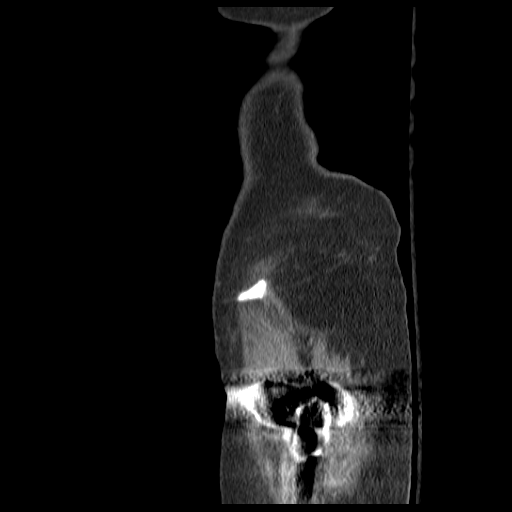

[14 of 32 positions shown; findings below may reference images not displayed]

FINDINGS: No pericardial or pleural effusion.  Minor dependent
opacity at the lung bases is atelectasis.  Osteopenia.
Degenerative changes in the spine.  Bilateral hip arthroplasties.
No acute osseous abnormality identified.

Streak artifact limits visualization of much of the pelvis due to
the bilateral hip implants.  No definite pelvic free fluid or
abnormality of the distal colon.  Visualized uterus within normal
limits.  The bladder is only faintly visible.

Oral contrast has reached the splenic flexure.  Normal proximal
colon.  Appendix not identified.  No pericecal inflammation is
evident.  Visible distal small bowel loops are within normal limits
and contain contrast.  No dilated proximal small bowel.
Decompressed stomach.  Duodenum within normal limits.

Gallbladder surgically absent.  Pneumobilia, otherwise no
significant intrahepatic biliary ductal dilatation.  The CBD also
appears within normal limits. Liver enhancement within normal
limits.

Negative spleen, pancreas, adrenal glands, and portal venous
system.  Ectasia and atherosclerosis of the aorta.  Ectasia and
atherosclerosis of the iliac arteries.

Chronic left renal cyst re-identified and stable.  No perinephric
stranding, hydronephrosis or hydroureter.  No abdominal free fluid.
No lymphadenopathy.
IMPRESSION: 1.  No acute findings identified in the abdomen.   Pneumobilia
suggests previous ERCP with sphincterotomy.  Gallbladder surgically
absent.
2.  Limited visualization of the pelvis due to bilateral hip
implants, with no acute findings identified.

## 2012-12-01 ENCOUNTER — Other Ambulatory Visit: Payer: Self-pay | Admitting: Family Medicine

## 2012-12-01 MED ORDER — VERAPAMIL HCL 40 MG PO TABS: ORAL_TABLET | ORAL | Status: DC

## 2012-12-09 ENCOUNTER — Other Ambulatory Visit (INDEPENDENT_AMBULATORY_CARE_PROVIDER_SITE_OTHER): Payer: Medicare Other

## 2012-12-09 DIAGNOSIS — D62 Acute posthemorrhagic anemia: Secondary | ICD-10-CM

## 2012-12-09 LAB — CBC WITH DIFFERENTIAL/PLATELET
Basophils Absolute: 0 10*3/uL (ref 0.0–0.1)
Eosinophils Relative: 3.9 % (ref 0.0–5.0)
Lymphs Abs: 0.9 10*3/uL (ref 0.7–4.0)
MCV: 88.2 fl (ref 78.0–100.0)
Monocytes Absolute: 0.4 10*3/uL (ref 0.1–1.0)
Monocytes Relative: 7.6 % (ref 3.0–12.0)
Neutrophils Relative %: 71.1 % (ref 43.0–77.0)
Platelets: 210 10*3/uL (ref 150.0–400.0)
RDW: 14.6 % (ref 11.5–14.6)
WBC: 5.1 10*3/uL (ref 4.5–10.5)

## 2012-12-09 LAB — IBC PANEL
Iron: 60 ug/dL (ref 42–145)
Transferrin: 305.7 mg/dL (ref 212.0–360.0)

## 2012-12-09 NOTE — Progress Notes (Signed)
Labs only

## 2013-01-28 ENCOUNTER — Other Ambulatory Visit: Payer: Self-pay | Admitting: Family Medicine

## 2013-01-28 MED ORDER — VERAPAMIL HCL 40 MG PO TABS: ORAL_TABLET | ORAL | Status: DC

## 2013-02-18 ENCOUNTER — Other Ambulatory Visit: Payer: Self-pay | Admitting: Family Medicine

## 2013-02-18 ENCOUNTER — Encounter: Payer: Self-pay | Admitting: Family Medicine

## 2013-02-18 ENCOUNTER — Ambulatory Visit (INDEPENDENT_AMBULATORY_CARE_PROVIDER_SITE_OTHER): Payer: Medicare Other | Admitting: Family Medicine

## 2013-02-18 VITALS — BP 158/118 | HR 138 | Temp 98.6°F | Ht 62.0 in | Wt 137.5 lb

## 2013-02-18 DIAGNOSIS — E039 Hypothyroidism, unspecified: Secondary | ICD-10-CM

## 2013-02-18 DIAGNOSIS — Z23 Encounter for immunization: Secondary | ICD-10-CM

## 2013-02-18 DIAGNOSIS — I10 Essential (primary) hypertension: Secondary | ICD-10-CM

## 2013-02-18 DIAGNOSIS — I4891 Unspecified atrial fibrillation: Secondary | ICD-10-CM

## 2013-02-18 MED ORDER — VERAPAMIL HCL ER 120 MG PO TBCR: 120.0000 mg | EXTENDED_RELEASE_TABLET | Freq: Every day | ORAL | Status: DC

## 2013-02-18 NOTE — Assessment & Plan Note (Signed)
Believed to be secondary to amiodarone toxicity, which she no longer takes. One TSH check s/p d/c of synthroid was normal--about 3 mo ago. Will check another today.  If this TSH is wnl, then will recheck again in 6 mo.

## 2013-02-18 NOTE — Progress Notes (Addendum)
Pre-visit discussion using our clinic review tool. No additional management support is needed unless otherwise documented below in the visit note.  OFFICE NOTE  02/18/2013  CC:  Chief Complaint  Patient presents with  . Follow-up    3 month     HPI: Patient is a 79 y.o. Caucasian female who is here for 3 mo f/u atrial fibrillation, history of hypothyroidism believed to be secondary to amiodarone toxicity.  Has been off this med for a few months, last TSH was normal about 3 mo ago.  Reports feeling fine.  No palpitations, CP, or SOB.   Daughter is giving her 1 40mg  short acting verapamil qAM and a 1/2 tab qPM. She is compliant with her ASA 162 mg qd.  Energy level unchanged.  Appetite is good.   Pertinent PMH:  Past Medical History  Diagnosis Date  . DM2 (diabetes mellitus, type 2)     need to clarify with old records still (as of 02/18/13)  . Hypertension   . Hearing loss   . Glaucoma   . PAF (paroxysmal atrial fibrillation)     EF IS 55-60%  . Hypothyroid     2nd to amiodarone--TSH stable off of thyroid med as of 02/18/13.  . Gait instability   . Fibula fracture   . Osteoarthritis   . Normocytic anemia   . Memory impairment     Mild  . COPD (chronic obstructive pulmonary disease)   . Amiodarone toxicity June 2013    stopped in June 2013 due to elevated LFT's, interstitial lung disease, thyroid abnormaility  . History of UTI     Hard to tell how frequent these have been    MEDS:  Outpatient Prescriptions Prior to Visit  Medication Sig Dispense Refill  . triamcinolone cream (KENALOG) 0.1 % Apply topically daily.      . verapamil (CALAN) 40 MG tablet Take 1/2-1 tablet by mouth 3 times a day  45 tablet  1   No facility-administered medications prior to visit.    PE: Blood pressure 158/118, pulse 138, temperature 98.6 F (37 C), temperature source Temporal, height 5\' 2"  (1.575 m), weight 137 lb 8 oz (62.37 kg), SpO2 98.00%. Repeat bp with manual cuff in both  arms was 150s over 90s. Gen: Alert, well appearing.  Patient is oriented to person, place, time, and situation. CV: Irreg irreg rhythm, rate about 130-140, no murmur or rub. Chest is clear, no wheezing or rales. Normal symmetric air entry throughout both lung fields. No chest wall deformities or tenderness. EXT: no clubbing, cyanosis, or edema.  Neuro: no tremor.  FNF normal bilat.  LAB: none today Lab Results  Component Value Date   TSH 3.89 11/19/2012     IMPRESSION AND PLAN:  Hypothyroid Believed to be secondary to amiodarone toxicity, which she no longer takes. One TSH check s/p d/c of synthroid was normal--about 3 mo ago. Will check another today.  If this TSH is wnl, then will recheck again in 6 mo.    2) A-fib with RVR and stage I HTN. Increase med: change to Calan SR 120mg  qd. Continue 162 mg ASA qd. Monitor bp/hr at home and bring numbers for review at next f/u.  Pneumovax IM today. She declined flu vaccine due to past "rxn" to this that she cannot specify. We'll likely to Tdap next f/u visit.  FOLLOW UP: 2 wks

## 2013-02-22 LAB — T3: T3, Total: 93 ng/dL (ref 80.0–204.0)

## 2013-03-04 ENCOUNTER — Encounter: Payer: Self-pay | Admitting: Family Medicine

## 2013-03-04 ENCOUNTER — Ambulatory Visit (INDEPENDENT_AMBULATORY_CARE_PROVIDER_SITE_OTHER): Payer: Medicare Other | Admitting: Family Medicine

## 2013-03-04 VITALS — BP 140/91 | HR 107 | Temp 97.7°F | Ht 62.0 in | Wt 136.0 lb

## 2013-03-04 DIAGNOSIS — Z23 Encounter for immunization: Secondary | ICD-10-CM

## 2013-03-04 DIAGNOSIS — I4891 Unspecified atrial fibrillation: Secondary | ICD-10-CM

## 2013-03-04 DIAGNOSIS — I10 Essential (primary) hypertension: Secondary | ICD-10-CM

## 2013-03-04 DIAGNOSIS — E039 Hypothyroidism, unspecified: Secondary | ICD-10-CM

## 2013-03-04 DIAGNOSIS — Z0000 Encounter for general adult medical examination without abnormal findings: Secondary | ICD-10-CM

## 2013-03-04 MED ORDER — TETANUS-DIPHTH-ACELL PERTUSSIS 5-2.5-18.5 LF-MCG/0.5 IM SUSP: 0.5000 mL | Freq: Once | INTRAMUSCULAR | Status: AC

## 2013-03-04 NOTE — Progress Notes (Signed)
Pre-visit discussion using our clinic review tool. No additional management support is needed unless otherwise documented below in the visit note.  OFFICE NOTE  03/04/2013  CC:  Chief Complaint  Patient presents with  . Follow-up    2 week     HPI: Patient is a 79 y.o. Caucasian female who is here for 2 wk f/u HTN and a-fib. Home bp's 130s-140s/90s and HR low 100s. Denies any palpitations, SOB, or CP.  Denies side effect from the meds.   Pertinent PMH:  Past Medical History  Diagnosis Date  . DM2 (diabetes mellitus, type 2)     need to clarify with old records still (as of 02/18/13)  . Hypertension   . Hearing loss   . Glaucoma   . PAF (paroxysmal atrial fibrillation)     EF IS 55-60%  . Hypothyroid     2nd to amiodarone--TSH stable off of thyroid med as of 02/18/13.  . Gait instability   . Fibula fracture   . Osteoarthritis   . Normocytic anemia   . Memory impairment     Mild  . COPD (chronic obstructive pulmonary disease)   . Amiodarone toxicity June 2013    stopped in June 2013 due to elevated LFT's, interstitial lung disease, thyroid abnormaility  . History of UTI     Hard to tell how frequent these have been    MEDS:  Outpatient Prescriptions Prior to Visit  Medication Sig Dispense Refill  . aspirin 81 MG tablet Take 81 mg by mouth 2 (two) times daily.      Marland Kitchen triamcinolone cream (KENALOG) 0.1 % Apply topically daily.      . verapamil (CALAN-SR) 120 MG CR tablet Take 1 tablet (120 mg total) by mouth at bedtime.  30 tablet  1   No facility-administered medications prior to visit.    PE: Blood pressure 140/91, pulse 107, temperature 97.7 F (36.5 C), temperature source Temporal, height 5\' 2"  (1.575 m), weight 136 lb (61.689 kg), SpO2 98.00%. Gen: Alert, well appearing.  Patient is oriented to person, place, time, and situation. CV: irreg irreg, rate about 110 Chest is clear, no wheezing or rales. Normal symmetric air entry throughout both lung fields. No  chest wall deformities or tenderness. EXT: no clubbing, cyanosis, or edema.   LAB: none today Lab Results  Component Value Date   TSH 4.585* 02/18/2013   T3TOTAL 93.0 02/18/2013  Free T4 normal 02/18/13   IMPRESSION AND PLAN:  Hypertension My goal for her is 140s/90s or less and she is there now. Continue Calan SR 120mg  qd and ASA 162mg  qd.  Atrial fibrillation She feels well on current med regimen, even though HR consistently a little over 100. I am hesitant to change things right now when she is feeling good. No coumadin due to high fall risk. ASA 162mg  qd.  Preventative health care Tdap today. Will offer prevnar 13 at next f/u visit.  Hypothyroid Amiodarone-induced?   TSHs OK off synthroid for a few months but gradually rising. Will recheck this in 30mo at next f/u.   An After Visit Summary was printed and given to the patient.  FOLLOW UP: 4 mo

## 2013-03-04 NOTE — Assessment & Plan Note (Signed)
Amiodarone-induced?   TSHs OK off synthroid for a few months but gradually rising. Will recheck this in 52mo at next f/u.

## 2013-03-04 NOTE — Assessment & Plan Note (Signed)
My goal for her is 140s/90s or less and she is there now. Continue Calan SR 120mg  qd and ASA 162mg  qd.

## 2013-03-04 NOTE — Addendum Note (Signed)
Addended by: Marlene Lard on: 03/04/2013 11:13 AM   Modules accepted: Orders

## 2013-03-04 NOTE — Assessment & Plan Note (Signed)
She feels well on current med regimen, even though HR consistently a little over 100. I am hesitant to change things right now when she is feeling good. No coumadin due to high fall risk. ASA 162mg  qd.

## 2013-03-04 NOTE — Assessment & Plan Note (Signed)
Tdap today. Will offer prevnar 13 at next f/u visit.

## 2013-04-19 ENCOUNTER — Other Ambulatory Visit: Payer: Self-pay | Admitting: *Deleted

## 2013-04-19 MED ORDER — VERAPAMIL HCL ER 120 MG PO TBCR: 120.0000 mg | EXTENDED_RELEASE_TABLET | Freq: Every day | ORAL | Status: DC

## 2013-06-13 ENCOUNTER — Other Ambulatory Visit: Payer: Self-pay | Admitting: Family Medicine

## 2013-07-01 ENCOUNTER — Encounter: Payer: Self-pay | Admitting: Family Medicine

## 2013-07-01 ENCOUNTER — Ambulatory Visit (INDEPENDENT_AMBULATORY_CARE_PROVIDER_SITE_OTHER): Payer: Medicare Other | Admitting: Family Medicine

## 2013-07-01 ENCOUNTER — Telehealth: Payer: Self-pay | Admitting: Family Medicine

## 2013-07-01 VITALS — BP 124/79 | HR 114 | Temp 98.0°F | Resp 18 | Ht 62.0 in | Wt 131.0 lb

## 2013-07-01 DIAGNOSIS — M549 Dorsalgia, unspecified: Secondary | ICD-10-CM

## 2013-07-01 DIAGNOSIS — R293 Abnormal posture: Secondary | ICD-10-CM

## 2013-07-01 DIAGNOSIS — I10 Essential (primary) hypertension: Secondary | ICD-10-CM

## 2013-07-01 DIAGNOSIS — E039 Hypothyroidism, unspecified: Secondary | ICD-10-CM

## 2013-07-01 DIAGNOSIS — I4891 Unspecified atrial fibrillation: Secondary | ICD-10-CM

## 2013-07-01 LAB — TSH: TSH: 3.05 u[IU]/mL (ref 0.35–5.50)

## 2013-07-01 LAB — T4, FREE: Free T4: 0.79 ng/dL (ref 0.60–1.60)

## 2013-07-01 NOTE — Progress Notes (Signed)
OFFICE NOTE  07/01/2013  CC:  Chief Complaint  Patient presents with  . Follow-up  . Back Pain     HPI: Patient is a 80 y.o. Caucasian female who is here for 3 mo f/u, also back pain. Having some upper back pain on and off for last 2d, mainly right side, sits in WC most of day and lots of nights. No fever, no urinary problems.  EAting normally.  No abd pains or nausea.  Taking chronic meds as instructed.  Denies palpitations, SOB, or CP or dizziness.  Pertinent PMH:  Past medical, surgical, social, and family history reviewed and no changes are noted since last office visit.  MEDS:  Outpatient Prescriptions Prior to Visit  Medication Sig Dispense Refill  . aspirin 81 MG tablet Take 81 mg by mouth 2 (two) times daily.      Marland Kitchen. triamcinolone cream (KENALOG) 0.1 % Apply topically daily.      . verapamil (CALAN-SR) 120 MG CR tablet TAKE 1 TABLET (120 MG TOTAL) BY MOUTH AT BEDTIME.  30 tablet  1   Facility-Administered Medications Prior to Visit  Medication Dose Route Frequency Provider Last Rate Last Dose  . Tdap (BOOSTRIX) injection 0.5 mL  0.5 mL Intramuscular Once Jeoffrey MassedPhilip H McGowen, MD        PE: Blood pressure 124/79, pulse 114, temperature 98 F (36.7 C), temperature source Temporal, resp. rate 18, height 5\' 2"  (1.575 m), weight 131 lb (59.421 kg), SpO2 96.00%. Sitting up in wheelchair but has a slouched posture. Gen: Alert, well appearing.  Patient is oriented to person, place, time, and situation. AFFECT: pleasant, lucid thought and speech.   ZOX:WRUEENT:Eyes: no injection, icteris, swelling, or exudate.  EOMI, PERRLA. Mouth: lips without lesion/swelling.  Oral mucosa pink and moist. Oropharynx without erythema, exudate, or swelling.  Shoulders and neck ROM intact w/out pain. No shoulders and neck and back have no TTP.  No rash. EXT: 3-4+ pitting edema in both LL's bilat, some flaky superficial desquamation but no erythema, tenderness, or skin breakdown.     IMPRESSION AND  PLAN:  1) Hx of hypothyroidism: has been doing ok/levels fine off synthroid. Will keep an eye on TSH, free T4, and T3 total today. Would like to not have to restart any synthroid since she does have a-fib with mild RVR.  2) A-fib, mild RVR: asymptomatic.  No change in meds today. Fall risk high, no coumadin.  Continue ASA 162 mg qd.  3) HTN: The current medical regimen is effective;  continue present plan and medications.  4) Prev health: needs prevnar in about 6 mo.  5) Musculoskeletal back pain related to poor posture and chronic wheelchair use: reassured. Encouraged pt to use comfortable chair, sofa, bed more AND ELEVATE LEGS MORE for LE edema.  An After Visit Summary was printed and given to the patient.  FOLLOW UP: 6 mo

## 2013-07-01 NOTE — Telephone Encounter (Signed)
Relevant patient education mailed to patient.  

## 2013-07-01 NOTE — Progress Notes (Signed)
Pre visit review using our clinic review tool, if applicable. No additional management support is needed unless otherwise documented below in the visit note. 

## 2013-08-09 ENCOUNTER — Other Ambulatory Visit: Payer: Self-pay | Admitting: Family Medicine

## 2013-12-26 ENCOUNTER — Encounter: Payer: Self-pay | Admitting: Family Medicine

## 2013-12-26 ENCOUNTER — Ambulatory Visit (INDEPENDENT_AMBULATORY_CARE_PROVIDER_SITE_OTHER): Payer: Medicare Other | Admitting: Family Medicine

## 2013-12-26 VITALS — BP 134/82 | HR 89 | Temp 98.4°F | Ht 62.0 in | Wt 124.2 lb

## 2013-12-26 DIAGNOSIS — E8881 Metabolic syndrome: Secondary | ICD-10-CM

## 2013-12-26 DIAGNOSIS — E119 Type 2 diabetes mellitus without complications: Secondary | ICD-10-CM

## 2013-12-26 DIAGNOSIS — Z0000 Encounter for general adult medical examination without abnormal findings: Secondary | ICD-10-CM

## 2013-12-26 DIAGNOSIS — Z23 Encounter for immunization: Secondary | ICD-10-CM

## 2013-12-26 DIAGNOSIS — E039 Hypothyroidism, unspecified: Secondary | ICD-10-CM

## 2013-12-26 DIAGNOSIS — I10 Essential (primary) hypertension: Secondary | ICD-10-CM

## 2013-12-26 DIAGNOSIS — I482 Chronic atrial fibrillation, unspecified: Secondary | ICD-10-CM

## 2013-12-26 DIAGNOSIS — H6123 Impacted cerumen, bilateral: Secondary | ICD-10-CM

## 2013-12-26 LAB — BASIC METABOLIC PANEL
BUN: 15 mg/dL (ref 6–23)
CALCIUM: 9.2 mg/dL (ref 8.4–10.5)
CO2: 25 mEq/L (ref 19–32)
CREATININE: 1 mg/dL (ref 0.4–1.2)
Chloride: 101 mEq/L (ref 96–112)
GFR: 54.44 mL/min — AB (ref >60.00)
GLUCOSE: 152 mg/dL — AB (ref 70–99)
Potassium: 3.7 mEq/L (ref 3.5–5.1)
Sodium: 135 mEq/L (ref 135–145)

## 2013-12-26 LAB — HEMOGLOBIN A1C: Hgb A1c MFr Bld: 5.9 % (ref 4.6–6.5)

## 2013-12-26 MED ORDER — VERAPAMIL HCL ER 120 MG PO TBCR: EXTENDED_RELEASE_TABLET | ORAL | Status: DC

## 2013-12-26 NOTE — Progress Notes (Signed)
Pre visit review using our clinic review tool, if applicable. No additional management support is needed unless otherwise documented below in the visit note. 

## 2013-12-26 NOTE — Progress Notes (Signed)
OFFICE NOTE  12/26/2013  CC:  Chief Complaint  Patient presents with  . Follow-up   HPI: Patient is a 80 y.o. Caucasian female who is here for 6 mo f/u a-fib and hypothyroidism.   Stopped thyroid med and ACE-I after last visit b/c bp low normal range and HR above 100. BP 130s/80s at home, HR 80s.  Feeling ok. Says hearing has been getting worse, wonders if they are full of wax. Has hearing aids but won't use them. LE swelling chronic,stable, she refuses to take diuretic. She has a couple of superficial ulcerations that weep sometimes--she picks/itches at some spots, mainly on RLL.  Doesn't elevate legs. Diet is low Na.   Pertinent PMH:  Past medical, surgical, social, and family history reviewed and no changes are noted since last office visit.  MEDS:  Outpatient Prescriptions Prior to Visit  Medication Sig Dispense Refill  . aspirin 81 MG tablet Take 81 mg by mouth 2 (two) times daily.      . verapamil (CALAN-SR) 120 MG CR tablet TAKE 1 TABLET (120 MG TOTAL) BY MOUTH AT BEDTIME.  30 tablet  5  . triamcinolone cream (KENALOG) 0.1 % Apply topically daily.       Facility-Administered Medications Prior to Visit  Medication Dose Route Frequency Provider Last Rate Last Dose  . Tdap (BOOSTRIX) injection 0.5 mL  0.5 mL Intramuscular Once Jeoffrey MassedPhilip H McGowen, MD        PE: Blood pressure 134/82, pulse 89, temperature 98.4 F (36.9 C), temperature source Temporal, height 5\' 2"  (1.575 m), weight 124 lb 4 oz (56.359 kg), SpO2 97.00%. Gen: Alert, well appearing.  Patient is oriented to person, place, time, and situation. ZOX:WRUEENT:Eyes: no injection, icteris, swelling, or exudate.  EOMI, PERRLA. Mouth: lips without lesion/swelling.  Oral mucosa pink and moist. Oropharynx without erythema, exudate, or swelling.  CV: Irreg irreg, rate 89, no murmur or rub Chest is clear, no wheezing or rales. Normal symmetric air entry throughout both lung fields. No chest wall deformities or tenderness. EXT:  bilat LL 3+ pitting edema with a few noninfected superficial ulcerations and scabs--R>>L regarding skin, but edema is symmetric.  IMPRESSION AND PLAN:  1) Atrial fibrillation; continue verapamil for rate control and ASA 162mg  qd for anticoagulation.  2) Hypothyroidism: off levothyroxine lately.  Recheck TSH.  3) Bilat cerumen impactions: cleared by myself with ear curette: 100% clear, TMs wnl bilat.  4) Bilat chronic LE edema: she refuses to take diuretic.  Continue best attempts at elevation of legs, low Na diet.  5) Prev health care: prevnar 13 and flu vaccines IM today.  An After Visit Summary was printed and given to the patient.  FOLLOW UP: 6 mo

## 2013-12-27 ENCOUNTER — Encounter: Payer: Self-pay | Admitting: Family Medicine

## 2013-12-27 LAB — TSH: TSH: 5.3 u[IU]/mL — ABNORMAL HIGH (ref 0.35–4.50)

## 2014-06-27 ENCOUNTER — Encounter: Payer: Self-pay | Admitting: Family Medicine

## 2014-06-27 ENCOUNTER — Ambulatory Visit (INDEPENDENT_AMBULATORY_CARE_PROVIDER_SITE_OTHER): Payer: Medicare Other | Admitting: Family Medicine

## 2014-06-27 VITALS — BP 110/60 | HR 126 | Temp 98.0°F | Ht 62.0 in

## 2014-06-27 DIAGNOSIS — N183 Chronic kidney disease, stage 3 unspecified: Secondary | ICD-10-CM

## 2014-06-27 DIAGNOSIS — E039 Hypothyroidism, unspecified: Secondary | ICD-10-CM

## 2014-06-27 DIAGNOSIS — I482 Chronic atrial fibrillation, unspecified: Secondary | ICD-10-CM

## 2014-06-27 DIAGNOSIS — I10 Essential (primary) hypertension: Secondary | ICD-10-CM | POA: Diagnosis not present

## 2014-06-27 LAB — BASIC METABOLIC PANEL
BUN: 23 mg/dL (ref 6–23)
CHLORIDE: 103 meq/L (ref 96–112)
CO2: 28 meq/L (ref 19–32)
CREATININE: 0.91 mg/dL (ref 0.40–1.20)
Calcium: 9.6 mg/dL (ref 8.4–10.5)
GFR: 62.03 mL/min (ref >60.00)
Glucose, Bld: 103 mg/dL — ABNORMAL HIGH (ref 70–99)
Potassium: 4.2 mEq/L (ref 3.5–5.1)
Sodium: 137 mEq/L (ref 135–145)

## 2014-06-27 LAB — TSH: TSH: 3.53 u[IU]/mL (ref 0.35–4.50)

## 2014-06-27 MED ORDER — UREA 20 % EX LOTN: TOPICAL_LOTION | CUTANEOUS | Status: DC

## 2014-06-27 MED ORDER — FLUTICASONE PROPIONATE 0.05 % EX CREA: TOPICAL_CREAM | CUTANEOUS | Status: DC

## 2014-06-27 NOTE — Progress Notes (Signed)
Pre visit review using our clinic review tool, if applicable. No additional management support is needed unless otherwise documented below in the visit note. 

## 2014-06-27 NOTE — Progress Notes (Signed)
OFFICE VISIT  06/27/2014   CC:  Chief Complaint  Patient presents with  . Follow-up    6 Months   HPI:    Patient is a 81 y.o. Caucasian female who presents accompanied by her daughter for 6 mo f/u HTN, A-fib, hypothyroidsim, osteoarthritis with hx of gait instability.  Has hx of IFG/insulin resistance, A1c 5.9%.  This was stable compared to check of this lab 4 yrs ago and 1 yr ago. Also hx of CRI stage III (CrCl 50s). Most recent TSH was up a little but we kept dose the same.  She is doing fairly well, using a rolling walker to get around at home. No c/o palpitations or racing heart.  In fact, she asks why she has to keep taking her two meds! No CP, no SOB, no focal weakness, no dysarthria, no swallowing problems.   Past Medical History  Diagnosis Date  . Insulin resistance     need to clarify with old records still (as of 02/18/13)  . Hypertension   . Hearing loss   . Glaucoma   . PAF (paroxysmal atrial fibrillation)     EF IS 55-60%  . Hypothyroid     2nd to amiodarone--TSH stable off of thyroid med as of 02/18/13.  . Gait instability   . Fibula fracture   . Osteoarthritis   . Normocytic anemia   . Memory impairment     Mild  . COPD (chronic obstructive pulmonary disease)   . Amiodarone toxicity June 2013    stopped in June 2013 due to elevated LFT's, interstitial lung disease, thyroid abnormaility  . History of UTI     Hard to tell how frequent these have been    Past Surgical History  Procedure Laterality Date  . Cholecystectomy    . Appendectomy    . Tonsillectomy    . Total hip arthroplasty      bilateral  . Transthoracic echocardiogram  2011    EF 55-60%, mild LVH, moderate mitral regurgitation.    Outpatient Prescriptions Prior to Visit  Medication Sig Dispense Refill  . aspirin 81 MG tablet Take 81 mg by mouth 2 (two) times daily.    . verapamil (CALAN-SR) 120 MG CR tablet TAKE 1 TABLET (120 MG TOTAL) BY MOUTH AT BEDTIME. 30 tablet 12    Facility-Administered Medications Prior to Visit  Medication Dose Route Frequency Provider Last Rate Last Dose  . Tdap (BOOSTRIX) injection 0.5 mL  0.5 mL Intramuscular Once Jeoffrey MassedPhilip H Emersyn Wyss, MD        No Known Allergies  ROS As per HPI  PE: Blood pressure 110/60, pulse 126, temperature 98 F (36.7 C), temperature source Oral, height 5\' 2"  (1.575 m), SpO2 93 %. Gen: Alert, well appearing.  Patient is oriented to person, place, time, and situation. AFFECT: pleasant, lucid thought and speech. Sitting in WC. CV: Regular, tachy to 120s, with frequent ectopy VS irregular irregular rhythm. No murmur or rub LUNGS: CTA bilat, nonlabored EXT: bilat LL pitting 2+ pitting edema, no rash on left leg but R leg pretibial region with pinkish hue and a few excoriations/scabs from where she has been scratching/picking.  LABS:  Lab Results  Component Value Date   TSH 5.30* 12/26/2013     Chemistry      Component Value Date/Time   NA 135 12/26/2013 1115   K 3.7 12/26/2013 1115   CL 101 12/26/2013 1115   CO2 25 12/26/2013 1115   BUN 15 12/26/2013 1115   CREATININE  1.0 12/26/2013 1115      Component Value Date/Time   CALCIUM 9.2 12/26/2013 1115   ALKPHOS 91 10/13/2012 0952   AST 22 10/13/2012 0952   ALT 14 10/13/2012 0952   BILITOT 0.6 10/13/2012 0952      IMPRESSION AND PLAN:  1) A-fib: rate control fair with verapamil/stable from symptom standpoint and pt very resistant to change in med/dosing.  Continue daily ASA 162 mg.  Pt not coumadin or DOAC candidate due to fall risk and this is also her preference.  2) Hypothyroidism: TSH normal or near normal OFF of synthroid.   Monitor TSH today.  3) CRI, stage III: lytes/cr today.  4) Chronic LE venous insufficiency, mild edema from this + R leg stasis dermatitis currently. Rx'd OTC urea 20% lotion to apply daily to both LL's. Also rx'd cutivate 0.05% cream to apply to affected inflamed areas bid prn. Signs of infection  discussed. Signs/symptoms to call or return for were reviewed and pt expressed understanding.  5) HTN; The current medical regimen is effective;  continue present plan and medications.  6) Hx of IFG/Insulin resistance. Repeat A1c at next f/u in 6 mo.  An After Visit Summary was printed and given to the patient.  FOLLOW UP: Return in about 6 months (around 12/27/2014) for routine chronic illness f/u.

## 2014-10-04 ENCOUNTER — Encounter: Payer: Self-pay | Admitting: Family Medicine

## 2014-10-04 ENCOUNTER — Ambulatory Visit (INDEPENDENT_AMBULATORY_CARE_PROVIDER_SITE_OTHER): Payer: Medicare Other | Admitting: Family Medicine

## 2014-10-04 VITALS — BP 95/66 | HR 112 | Temp 97.4°F | Resp 16

## 2014-10-04 DIAGNOSIS — I872 Venous insufficiency (chronic) (peripheral): Secondary | ICD-10-CM | POA: Diagnosis not present

## 2014-10-04 DIAGNOSIS — I4891 Unspecified atrial fibrillation: Secondary | ICD-10-CM

## 2014-10-04 DIAGNOSIS — L981 Factitial dermatitis: Secondary | ICD-10-CM | POA: Diagnosis not present

## 2014-10-04 MED ORDER — GABAPENTIN 300 MG PO CAPS: ORAL_CAPSULE | ORAL | Status: DC

## 2014-10-04 MED ORDER — FUROSEMIDE 40 MG PO TABS: 40.0000 mg | ORAL_TABLET | Freq: Every day | ORAL | Status: DC

## 2014-10-04 NOTE — Progress Notes (Signed)
OFFICE NOTE  10/04/2014  CC:  Chief Complaint  Patient presents with  . Rash    x 2 weeks  . Leg Swelling     HPI: Patient is a 81 y.o. Caucasian female who is here for rash.  Little pink bumps started coming up slowly recently, unclear time course (pt and daughter poor historians)--and she starts scratching each one vigorously until she draws blood.  This has made her stop taking her aspirin.  No recent change in anything: no potential contact allergens/irritants, no new bed/travel/contacts that would put her at risk for scabies.  No one else in her home has any similar skin lesions.  Also, worsening LE edema, with a weeping area on each LL.  She is wheelchair-bound and does not elevate her legs. Eating and drinking well lately.  She tries to eat low Na diet.  Pertinent PMH:  Past medical, surgical, social, and family history reviewed and no changes are noted since last office visit.  MEDS: Not taking ASA, fluticasone cream, or urea cream listed below Outpatient Prescriptions Prior to Visit  Medication Sig Dispense Refill  . verapamil (CALAN-SR) 120 MG CR tablet TAKE 1 TABLET (120 MG TOTAL) BY MOUTH AT BEDTIME. 30 tablet 12  . aspirin 81 MG tablet Take 81 mg by mouth 2 (two) times daily.    . fluticasone (CUTIVATE) 0.05 % cream Apply to affected (inflamed and itchy) areas of LL's as needed (Patient not taking: Reported on 10/04/2014) 30 g 3  . urea (CARMOL) 20 % lotion Apply to lower legs once daily (Patient not taking: Reported on 10/04/2014) 18 mL 0   Facility-Administered Medications Prior to Visit  Medication Dose Route Frequency Provider Last Rate Last Dose  . Tdap (BOOSTRIX) injection 0.5 mL  0.5 mL Intramuscular Once Jeoffrey MassedPhilip H Tasman Zapata, MD        PE: Blood pressure 95/66, pulse 112, temperature 97.4 F (36.3 C), temperature source Oral, resp. rate 16. Gen: Alert, well appearing.  Patient is oriented to person, place, time, and situation. Face w/out rash. From shoulders down  to mid abdomen in front and down to mid back  She has scattered pinkish papules that are excoriated.  No erythematous or tender lesions.  No streaking.  No vesicles or pustules. LL's: bilat 3+ pitting edema to just below the knees, with several areas of the skin lesions that she has scratched that are leaking clear edematous fluid.  No significant ulcer of skin.  Pinkish hue to skin c/w chronic LE venous stasis edema/mild dermatitis but nothing active.  LAB:   Chemistry      Component Value Date/Time   NA 137 06/27/2014 1052   K 4.2 06/27/2014 1052   CL 103 06/27/2014 1052   CO2 28 06/27/2014 1052   BUN 23 06/27/2014 1052   CREATININE 0.91 06/27/2014 1052      Component Value Date/Time   CALCIUM 9.6 06/27/2014 1052   ALKPHOS 91 10/13/2012 0952   AST 22 10/13/2012 0952   ALT 14 10/13/2012 0952   BILITOT 0.6 10/13/2012 0952       IMPRESSION AND PLAN:  1) Rash, unclear etiology, with neurotic excoriations. Will try starting neurontin 300 mg qhs x 1 week, increase to bid if she tolerates this med ok.  2) LE venous insuff edema:  Start lasix 40mg  qd, discussed care of LE weeping areas. Compression stockings rx'd: 30 mmHg pressure. Recheck 6-7 d and check BMET at that time.  3) A-fib: rate not well controlled right now  but bp is low normal range and she is asymptomatic so I am hesitant to increase her verapamil.  Hopefully diuresing her will help. Emphasized need for her to be compliant with aspirin.  An After Visit Summary was printed and given to the patient.  FOLLOW UP: 6-7 days

## 2014-10-04 NOTE — Progress Notes (Signed)
Pre visit review using our clinic review tool, if applicable. No additional management support is needed unless otherwise documented below in the visit note. 

## 2014-10-04 NOTE — Patient Instructions (Signed)
Please take your aspirin every day

## 2014-10-13 ENCOUNTER — Encounter: Payer: Self-pay | Admitting: Family Medicine

## 2014-10-13 ENCOUNTER — Ambulatory Visit (INDEPENDENT_AMBULATORY_CARE_PROVIDER_SITE_OTHER): Payer: Medicare Other | Admitting: Family Medicine

## 2014-10-13 VITALS — BP 105/69 | HR 116 | Temp 97.9°F | Resp 16

## 2014-10-13 DIAGNOSIS — L981 Factitial dermatitis: Secondary | ICD-10-CM | POA: Diagnosis not present

## 2014-10-13 DIAGNOSIS — L602 Onychogryphosis: Secondary | ICD-10-CM

## 2014-10-13 DIAGNOSIS — L609 Nail disorder, unspecified: Secondary | ICD-10-CM

## 2014-10-13 DIAGNOSIS — R609 Edema, unspecified: Secondary | ICD-10-CM

## 2014-10-13 DIAGNOSIS — S0083XA Contusion of other part of head, initial encounter: Secondary | ICD-10-CM

## 2014-10-13 LAB — BASIC METABOLIC PANEL
BUN: 28 mg/dL — ABNORMAL HIGH (ref 6–23)
CO2: 36 meq/L — AB (ref 19–32)
CREATININE: 1.05 mg/dL (ref 0.40–1.20)
Calcium: 9 mg/dL (ref 8.4–10.5)
Chloride: 96 mEq/L (ref 96–112)
GFR: 52.55 mL/min — AB (ref >60.00)
Glucose, Bld: 143 mg/dL — ABNORMAL HIGH (ref 70–99)
POTASSIUM: 3.4 meq/L — AB (ref 3.5–5.1)
SODIUM: 139 meq/L (ref 135–145)

## 2014-10-13 MED ORDER — POTASSIUM CHLORIDE 40 MEQ/15ML (20%) PO SOLN: 40.0000 meq | Freq: Every day | ORAL | Status: AC

## 2014-10-13 MED ORDER — UREA 20 % EX CREA: TOPICAL_CREAM | CUTANEOUS | Status: AC

## 2014-10-13 NOTE — Progress Notes (Signed)
OFFICE NOTE  10/14/2014  CC:  Chief Complaint  Patient presents with  . Follow-up    6-7 day f/u  . Fall    at home while sleeping in chair, slid off chair and hit her head   HPI: Patient is a 81 y.o. Caucasian female who is here for 10 d f/u rash, LE venous insufficiency edema. Not itching anymore: gabapentin is helping a lot with this.  No further weeping from her LE edema since she has stopped scratching at the area.  She recently fell asleep sitting in WC, slumped over and fell to floor and hit forehead.  Daughter says she is acting normally since.  No HA, no tremulousness, no focal weakness.   Eating and drinking fine.   Pertinent PMH:  Past medical, surgical, social, and family history reviewed and no changes are noted since last office visit.  MEDS:  Outpatient Prescriptions Prior to Visit  Medication Sig Dispense Refill  . aspirin 81 MG tablet Take 81 mg by mouth 2 (two) times daily.    . furosemide (LASIX) 40 MG tablet Take 1 tablet (40 mg total) by mouth daily. 30 tablet 1  . gabapentin (NEURONTIN) 300 MG capsule 1 tab by mouth twice daily 60 capsule 1  . verapamil (CALAN-SR) 120 MG CR tablet TAKE 1 TABLET (120 MG TOTAL) BY MOUTH AT BEDTIME. 30 tablet 12  . fluticasone (CUTIVATE) 0.05 % cream Apply to affected (inflamed and itchy) areas of LL's as needed (Patient not taking: Reported on 10/04/2014) 30 g 3  . urea (CARMOL) 20 % lotion Apply to lower legs once daily (Patient not taking: Reported on 10/04/2014) 18 mL 0   Facility-Administered Medications Prior to Visit  Medication Dose Route Frequency Provider Last Rate Last Dose  . Tdap (BOOSTRIX) injection 0.5 mL  0.5 mL Intramuscular Once Jeoffrey Massed, MD        PE: Blood pressure 105/69, pulse 116, temperature 97.9 F (36.6 C), temperature source Oral, resp. rate 16, SpO2 97 %. Gen: Alert, well appearing.   Forehead with bruise centrally. Speech is clear. Oropharynx moist. LE's: from mid tibial level down into  feet she has 3+ pitting edema.  No weeping of skin. She has a dozen or so scab-type lesions scattered over legs, arms, trunk.  None of these have erythema or ulceration or tenderness. Nails of both feet: hypertrophied/thickened/deformed.   IMPRESSION AND PLAN:  1) LE venous insufficiency edema; improved s/p  qd lasix x 10d.  She has moderate stasis dermatitis but I want to avoid topical steroid with her.  No sign of cellulitis.  Check BMET.  Unfortunately, she could not find compression stockings that she could get on or tolerate.  Elevation of legs is something she largely refuses to do.  Low Na diet: doing ok with this. Will add urea 20 % cream to try to improve skin condition of LL's.  2) Neurotic excoriations: continue neurontin as-is.  3) Hypertrophic toe nails: refer to podiatry for foot care.  4) Forehead contusion: mild. Monitor.  An After Visit Summary was printed and given to the patient.  FOLLOW UP: 4 wks

## 2014-10-13 NOTE — Progress Notes (Signed)
Pre visit review using our clinic review tool, if applicable. No additional management support is needed unless otherwise documented below in the visit note. 

## 2014-11-09 ENCOUNTER — Ambulatory Visit (INDEPENDENT_AMBULATORY_CARE_PROVIDER_SITE_OTHER): Payer: Medicare Other | Admitting: Family Medicine

## 2014-11-09 ENCOUNTER — Encounter: Payer: Self-pay | Admitting: Family Medicine

## 2014-11-09 VITALS — BP 106/75 | HR 150 | Temp 98.0°F | Resp 16

## 2014-11-09 DIAGNOSIS — I872 Venous insufficiency (chronic) (peripheral): Secondary | ICD-10-CM | POA: Diagnosis not present

## 2014-11-09 DIAGNOSIS — R600 Localized edema: Secondary | ICD-10-CM

## 2014-11-09 DIAGNOSIS — I10 Essential (primary) hypertension: Secondary | ICD-10-CM

## 2014-11-09 DIAGNOSIS — E876 Hypokalemia: Secondary | ICD-10-CM | POA: Diagnosis not present

## 2014-11-09 DIAGNOSIS — I480 Paroxysmal atrial fibrillation: Secondary | ICD-10-CM

## 2014-11-09 LAB — BASIC METABOLIC PANEL
BUN: 24 mg/dL — ABNORMAL HIGH (ref 6–23)
CHLORIDE: 98 meq/L (ref 96–112)
CO2: 34 meq/L — AB (ref 19–32)
CREATININE: 1.09 mg/dL (ref 0.40–1.20)
Calcium: 9.1 mg/dL (ref 8.4–10.5)
GFR: 50.32 mL/min — ABNORMAL LOW (ref >60.00)
Glucose, Bld: 164 mg/dL — ABNORMAL HIGH (ref 70–99)
Potassium: 3.6 mEq/L (ref 3.5–5.1)
SODIUM: 138 meq/L (ref 135–145)

## 2014-11-09 LAB — MAGNESIUM: Magnesium: 2.1 mg/dL (ref 1.5–2.5)

## 2014-11-09 NOTE — Progress Notes (Signed)
OFFICE VISIT  11/09/2014   CC:  Chief Complaint  Patient presents with  . Leg Swelling   HPI:    Patient is a 81 y.o. Caucasian female who presents for 1 mo f/u HTN, atrial fibrillation, and LE edema secondary to LE venous insufficiency and nearly chronic use of wheelchair.  Potassium has been a little low so I started supplement last time she was here. Some noncompliance b/c she doesn't want to take meds, but she is ABLE to take/tolerate meds fine. Tried compression stockings x 2 weeks and it didn't help per daughter's report.  She is not compliant with leg elevation.    No fevers, no pain in legs/feet.   She is becoming increasingly obstinate and difficult to care for per daughter.  Often says she won't take meds and often says she just wants to die.  However, she denies depression.  Past Medical History  Diagnosis Date  . Insulin resistance     need to clarify with old records still (as of 02/18/13)  . Hypertension   . Hearing loss   . Glaucoma   . PAF (paroxysmal atrial fibrillation)     EF IS 55-60%  . Hypothyroid     2nd to amiodarone--TSH stable off of thyroid med as of 02/18/13.  . Gait instability   . Fibula fracture   . Osteoarthritis   . Normocytic anemia   . Memory impairment     Mild  . COPD (chronic obstructive pulmonary disease)   . Amiodarone toxicity June 2013    stopped in June 2013 due to elevated LFT's, interstitial lung disease, thyroid abnormaility  . History of UTI     Hard to tell how frequent these have been  . Venous insufficiency of both lower extremities     Past Surgical History  Procedure Laterality Date  . Cholecystectomy    . Appendectomy    . Tonsillectomy    . Total hip arthroplasty      bilateral  . Transthoracic echocardiogram  2011    EF 55-60%, mild LVH, moderate mitral regurgitation.    Outpatient Prescriptions Prior to Visit  Medication Sig Dispense Refill  . aspirin 81 MG tablet Take 81 mg by mouth 2 (two) times daily.     . furosemide (LASIX) 40 MG tablet Take 1 tablet (40 mg total) by mouth daily. 30 tablet 1  . gabapentin (NEURONTIN) 300 MG capsule 1 tab by mouth twice daily 60 capsule 1  . Potassium Chloride 40 MEQ/15ML (20%) SOLN Take 40 mEq by mouth daily. 1 Bottle 3  . urea (CARMOL) 20 % cream Apply once daily to lower legs 75 g 6  . verapamil (CALAN-SR) 120 MG CR tablet TAKE 1 TABLET (120 MG TOTAL) BY MOUTH AT BEDTIME. 30 tablet 12   Facility-Administered Medications Prior to Visit  Medication Dose Route Frequency Provider Last Rate Last Dose  . Tdap (BOOSTRIX) injection 0.5 mL  0.5 mL Intramuscular Once Jeoffrey Massed, MD        No Known Allergies  ROS As per HPI  PE: Blood pressure 106/75, pulse 150, temperature 98 F (36.7 C), temperature source Oral, resp. rate 16. Gen: Alert, well appearing but frail, sitting in WC.  Patient is oriented to person, place, time, and situation. Smiling, very hard of hearing. CV: RRR, ectopic beat about every 8-10 beats, rate about 90-100 Chest is clear, no wheezing or rales. Normal symmetric air entry throughout both lung fields. No chest wall deformities or tenderness. EXT: pink/hyperemic  discoloration to LL's particularly from mid tibia level down.  About 3-4+ edema present bilat from mid tibia down into feet, with occ drop of weeping fluid noted but only rare excoriated/scabbed area and with no ulcerations and no areas of erythema to suggest infection/cellulitis.   LABS:    Chemistry      Component Value Date/Time   NA 139 10/13/2014 1152   K 3.4* 10/13/2014 1152   CL 96 10/13/2014 1152   CO2 36* 10/13/2014 1152   BUN 28* 10/13/2014 1152   CREATININE 1.05 10/13/2014 1152      Component Value Date/Time   CALCIUM 9.0 10/13/2014 1152   ALKPHOS 91 10/13/2012 0952   AST 22 10/13/2012 0952   ALT 14 10/13/2012 0952   BILITOT 0.6 10/13/2012 0952     Lab Results  Component Value Date   WBC 5.1 12/09/2012   HGB 12.1 12/09/2012   HCT 36.5  12/09/2012   MCV 88.2 12/09/2012   PLT 210.0 12/09/2012   Lab Results  Component Value Date   TSH 3.53 06/27/2014   Lab Results  Component Value Date   HGBA1C 5.9 12/26/2013    IMPRESSION AND PLAN:  1) LE venous insuff edema: noncompliant with elevation of legs and per daughter the compression stockings 20-30 mm Hg pressure did not help--but she bought the ones that only went from ankle to knee (not including feet compression). Daughter and patient refuse to allow me to go up on her diuretic any at this time, citing the fact she already is too messy from a urine/feces clean-up standpoint.   With hx of mild hypokalemia on diuretic I recently started [facility] so I will recheck BMET today. Will refer to [facility] to see if anything they can offer.  2) PAF: unclear whether in sinus or a-fib today.  Rate is controlled relatively well.  Continue ASA qd. No change in meds today.  3) HTN: The current medical regimen is effective;  continue present plan and medications.  An After Visit Summary was printed and given to the patient.  FOLLOW UP: Return in about 4 months (around 03/11/2015).

## 2014-11-09 NOTE — Progress Notes (Signed)
Pre visit review using our clinic review tool, if applicable. No additional management support is needed unless otherwise documented below in the visit note. 

## 2014-11-10 ENCOUNTER — Telehealth: Payer: Self-pay | Admitting: Family Medicine

## 2014-11-10 NOTE — Telephone Encounter (Signed)
Pts daughter advised and voiced understanding.  

## 2014-11-10 NOTE — Telephone Encounter (Signed)
Can you add a HbA1c to the labs done yesterday?  Dx is hyperglycemia.-thx

## 2014-11-10 NOTE — Telephone Encounter (Signed)
No sorry, no lav tube drawn.

## 2014-11-10 NOTE — Telephone Encounter (Signed)
Pls notify pt's daughter that her labs yesterday were all stable.-thx

## 2014-12-01 ENCOUNTER — Other Ambulatory Visit: Payer: Self-pay | Admitting: *Deleted

## 2014-12-01 MED ORDER — GABAPENTIN 300 MG PO CAPS: ORAL_CAPSULE | ORAL | Status: AC

## 2014-12-01 MED ORDER — FUROSEMIDE 40 MG PO TABS: 40.0000 mg | ORAL_TABLET | Freq: Every day | ORAL | Status: AC

## 2014-12-01 NOTE — Telephone Encounter (Signed)
RF request for furosemide LOV: 11/09/14 Next ov: 03/13/15 Last written: 10/04/14 #30 w/ 1RF

## 2014-12-01 NOTE — Telephone Encounter (Signed)
RF request for gabapentin LOV: 11/09/14 Next ov: 03/13/15 Last written: 10/04/14 #60 w/ 1RF

## 2014-12-13 ENCOUNTER — Encounter: Payer: Self-pay | Admitting: Family Medicine

## 2014-12-19 ENCOUNTER — Encounter: Payer: Self-pay | Admitting: Family Medicine

## 2015-01-09 ENCOUNTER — Ambulatory Visit: Payer: Medicare Other | Admitting: Podiatry

## 2015-01-11 ENCOUNTER — Other Ambulatory Visit: Payer: Self-pay | Admitting: *Deleted

## 2015-01-11 MED ORDER — VERAPAMIL HCL ER 120 MG PO TBCR: EXTENDED_RELEASE_TABLET | ORAL | Status: DC

## 2015-01-11 NOTE — Telephone Encounter (Signed)
RF request for verapamil LOV: 11/09/14 Next ov: 03/13/15 Last written: 12/26/13 #30 w/ 12

## 2015-01-12 ENCOUNTER — Other Ambulatory Visit: Payer: Self-pay | Admitting: Family Medicine

## 2015-01-12 MED ORDER — VERAPAMIL HCL ER 120 MG PO TBCR: EXTENDED_RELEASE_TABLET | ORAL | Status: AC

## 2015-02-21 ENCOUNTER — Encounter: Payer: Self-pay | Admitting: Family Medicine

## 2015-02-21 DIAGNOSIS — I119 Hypertensive heart disease without heart failure: Secondary | ICD-10-CM | POA: Insufficient documentation

## 2015-02-22 DEATH — deceased

## 2015-03-13 ENCOUNTER — Ambulatory Visit: Payer: Medicare Other | Admitting: Family Medicine
